# Patient Record
Sex: Female | Born: 1970
Health system: Southern US, Community
[De-identification: ages and names within clinical notes are randomized; demographics above are authoritative.]

## PROBLEM LIST (undated history)

## (undated) DIAGNOSIS — I8390 Asymptomatic varicose veins of unspecified lower extremity: Secondary | ICD-10-CM

## (undated) DIAGNOSIS — E739 Lactose intolerance, unspecified: Secondary | ICD-10-CM

## (undated) DIAGNOSIS — R51 Headache: Secondary | ICD-10-CM

## (undated) DIAGNOSIS — E119 Type 2 diabetes mellitus without complications: Secondary | ICD-10-CM

## (undated) DIAGNOSIS — R81 Glycosuria: Secondary | ICD-10-CM

## (undated) DIAGNOSIS — Z8632 Personal history of gestational diabetes: Secondary | ICD-10-CM

## (undated) DIAGNOSIS — N920 Excessive and frequent menstruation with regular cycle: Secondary | ICD-10-CM

## (undated) DIAGNOSIS — N921 Excessive and frequent menstruation with irregular cycle: Secondary | ICD-10-CM

## (undated) DIAGNOSIS — N926 Irregular menstruation, unspecified: Secondary | ICD-10-CM

## (undated) DIAGNOSIS — Z8742 Personal history of other diseases of the female genital tract: Secondary | ICD-10-CM

## (undated) DIAGNOSIS — Z87440 Personal history of urinary (tract) infections: Secondary | ICD-10-CM

## (undated) DIAGNOSIS — Z8619 Personal history of other infectious and parasitic diseases: Secondary | ICD-10-CM

## (undated) DIAGNOSIS — N840 Polyp of corpus uteri: Secondary | ICD-10-CM

## (undated) HISTORY — DX: Personal history of urinary (tract) infections: Z87.440

## (undated) HISTORY — DX: Personal history of other diseases of the female genital tract: Z87.42

## (undated) HISTORY — DX: Personal history of gestational diabetes: Z86.32

## (undated) HISTORY — PX: WISDOM TOOTH EXTRACTION: SHX21

## (undated) HISTORY — DX: Personal history of other infectious and parasitic diseases: Z86.19

## (undated) HISTORY — DX: Irregular menstruation, unspecified: N92.6

## (undated) HISTORY — PX: DILATION AND CURETTAGE OF UTERUS: SHX78

## (undated) HISTORY — DX: Excessive and frequent menstruation with regular cycle: N92.0

## (undated) HISTORY — DX: Glycosuria: R81

## (undated) HISTORY — DX: Lactose intolerance, unspecified: E73.9

## (undated) HISTORY — PX: BREAST SURGERY: SHX581

## (undated) HISTORY — DX: Polyp of corpus uteri: N84.0

## (undated) HISTORY — DX: Excessive and frequent menstruation with irregular cycle: N92.1

---
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
## 1995-01-13 DIAGNOSIS — Z87440 Personal history of urinary (tract) infections: Secondary | ICD-10-CM

## 1995-01-13 HISTORY — DX: Personal history of urinary (tract) infections: Z87.440

## 1997-10-11 ENCOUNTER — Ambulatory Visit (HOSPITAL_COMMUNITY): Admission: RE | Admit: 1997-10-11 | Discharge: 1997-10-11 | Payer: Self-pay | Admitting: Gynecology

## 1997-12-21 ENCOUNTER — Ambulatory Visit (HOSPITAL_COMMUNITY): Admission: RE | Admit: 1997-12-21 | Discharge: 1997-12-21 | Payer: Self-pay | Admitting: *Deleted

## 1998-01-01 ENCOUNTER — Encounter: Admission: RE | Admit: 1998-01-01 | Discharge: 1998-04-01 | Payer: Self-pay | Admitting: Obstetrics & Gynecology

## 1998-01-12 DIAGNOSIS — Z8632 Personal history of gestational diabetes: Secondary | ICD-10-CM

## 1998-01-12 HISTORY — DX: Personal history of gestational diabetes: Z86.32

## 1998-02-26 ENCOUNTER — Inpatient Hospital Stay (HOSPITAL_COMMUNITY): Admission: AD | Admit: 1998-02-26 | Discharge: 1998-02-28 | Payer: Self-pay | Admitting: Obstetrics and Gynecology

## 1998-03-03 ENCOUNTER — Inpatient Hospital Stay (HOSPITAL_COMMUNITY): Admission: AD | Admit: 1998-03-03 | Discharge: 1998-03-03 | Payer: Self-pay | Admitting: Obstetrics & Gynecology

## 1998-06-13 ENCOUNTER — Other Ambulatory Visit: Admission: RE | Admit: 1998-06-13 | Discharge: 1998-06-13 | Payer: Self-pay | Admitting: *Deleted

## 1998-08-15 ENCOUNTER — Other Ambulatory Visit: Admission: RE | Admit: 1998-08-15 | Discharge: 1998-08-15 | Payer: Self-pay | Admitting: *Deleted

## 1999-01-13 DIAGNOSIS — Z8742 Personal history of other diseases of the female genital tract: Secondary | ICD-10-CM

## 1999-01-13 HISTORY — DX: Personal history of other diseases of the female genital tract: Z87.42

## 1999-01-23 ENCOUNTER — Other Ambulatory Visit: Admission: RE | Admit: 1999-01-23 | Discharge: 1999-01-23 | Payer: Self-pay | Admitting: Obstetrics & Gynecology

## 1999-04-29 ENCOUNTER — Encounter (INDEPENDENT_AMBULATORY_CARE_PROVIDER_SITE_OTHER): Payer: Self-pay | Admitting: Specialist

## 1999-04-29 ENCOUNTER — Other Ambulatory Visit: Admission: RE | Admit: 1999-04-29 | Discharge: 1999-04-29 | Payer: Self-pay | Admitting: *Deleted

## 1999-10-01 ENCOUNTER — Encounter: Admission: RE | Admit: 1999-10-01 | Discharge: 1999-10-01 | Payer: Self-pay | Admitting: *Deleted

## 1999-10-01 ENCOUNTER — Encounter: Payer: Self-pay | Admitting: *Deleted

## 1999-10-21 ENCOUNTER — Other Ambulatory Visit: Admission: RE | Admit: 1999-10-21 | Discharge: 1999-10-21 | Payer: Self-pay | Admitting: *Deleted

## 2000-02-17 ENCOUNTER — Encounter: Payer: Self-pay | Admitting: Emergency Medicine

## 2000-02-17 ENCOUNTER — Emergency Department (HOSPITAL_COMMUNITY): Admission: EM | Admit: 2000-02-17 | Discharge: 2000-02-17 | Payer: Self-pay | Admitting: Emergency Medicine

## 2000-08-23 ENCOUNTER — Other Ambulatory Visit: Admission: RE | Admit: 2000-08-23 | Discharge: 2000-08-23 | Payer: Self-pay | Admitting: *Deleted

## 2001-03-10 ENCOUNTER — Other Ambulatory Visit: Admission: RE | Admit: 2001-03-10 | Discharge: 2001-03-10 | Payer: Self-pay | Admitting: *Deleted

## 2001-04-03 ENCOUNTER — Encounter: Payer: Self-pay | Admitting: *Deleted

## 2001-04-03 ENCOUNTER — Ambulatory Visit (HOSPITAL_COMMUNITY): Admission: RE | Admit: 2001-04-03 | Discharge: 2001-04-03 | Payer: Self-pay | Admitting: *Deleted

## 2001-04-23 ENCOUNTER — Ambulatory Visit (HOSPITAL_COMMUNITY): Admission: RE | Admit: 2001-04-23 | Discharge: 2001-04-23 | Payer: Self-pay | Admitting: Neurology

## 2001-04-23 ENCOUNTER — Encounter: Payer: Self-pay | Admitting: Neurology

## 2001-08-15 ENCOUNTER — Other Ambulatory Visit: Admission: RE | Admit: 2001-08-15 | Discharge: 2001-08-15 | Payer: Self-pay | Admitting: *Deleted

## 2001-11-09 ENCOUNTER — Encounter: Payer: Self-pay | Admitting: *Deleted

## 2001-11-09 ENCOUNTER — Encounter: Admission: RE | Admit: 2001-11-09 | Discharge: 2001-11-09 | Payer: Self-pay | Admitting: *Deleted

## 2002-02-16 ENCOUNTER — Other Ambulatory Visit: Admission: RE | Admit: 2002-02-16 | Discharge: 2002-02-16 | Payer: Self-pay | Admitting: *Deleted

## 2002-12-22 ENCOUNTER — Emergency Department (HOSPITAL_COMMUNITY): Admission: EM | Admit: 2002-12-22 | Discharge: 2002-12-22 | Payer: Self-pay | Admitting: Emergency Medicine

## 2002-12-22 IMAGING — US US ABDOMEN COMPLETE
1 series · 14 of 25 positions shown · non-contrast
Comparison: none

CLINICAL DATA: Abdominal pain.
 ABDOMEN ULTRASOUND ? 12/22/02

[Series 1: unknown · 0.28mm/px · 14 of 54 slices shown]
[im 1/54]
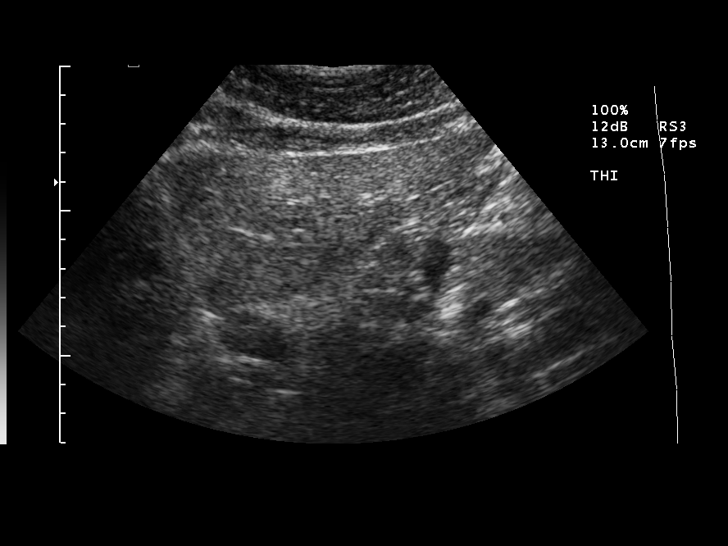
[im 5/54]
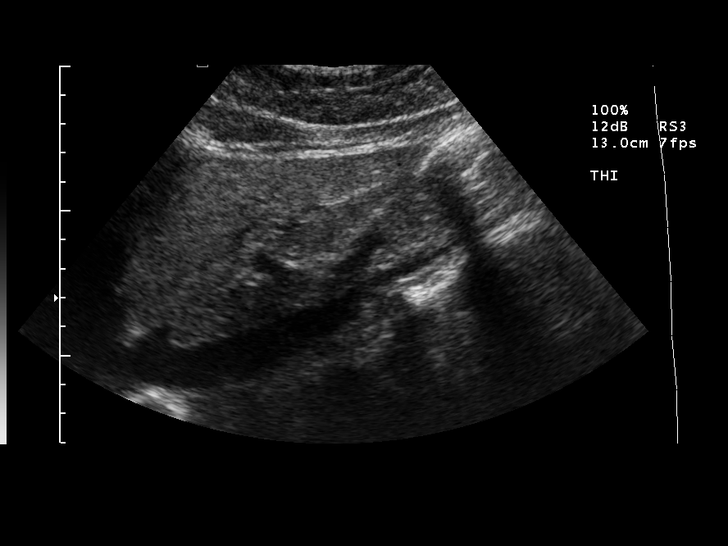
[im 9/54]
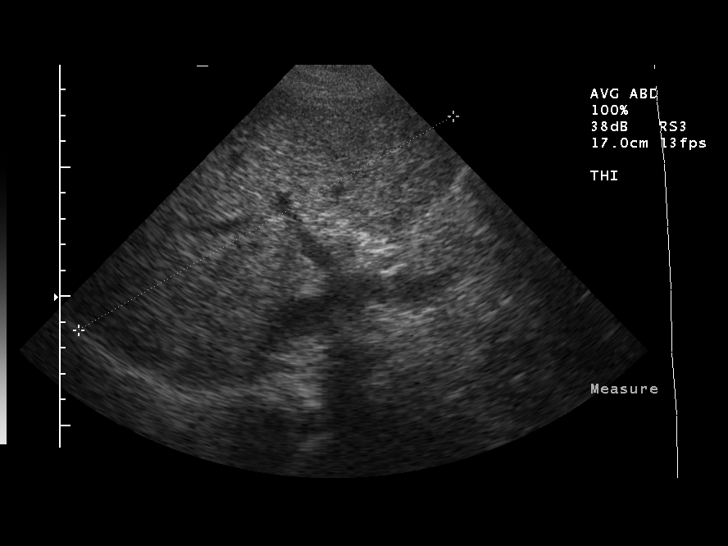
[im 14/54]
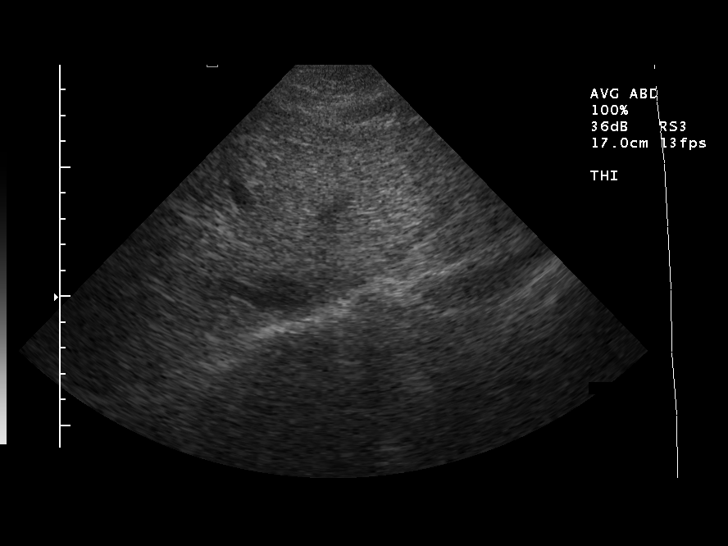
[im 18/54]
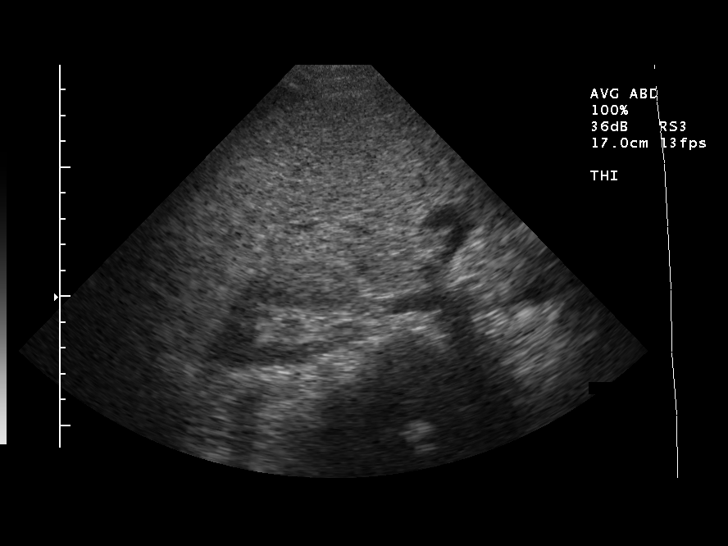
[im 20/54]
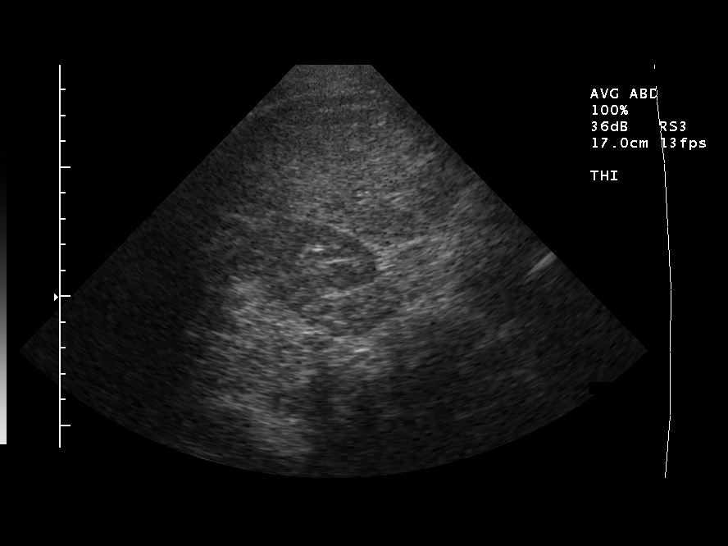
[im 25/54]
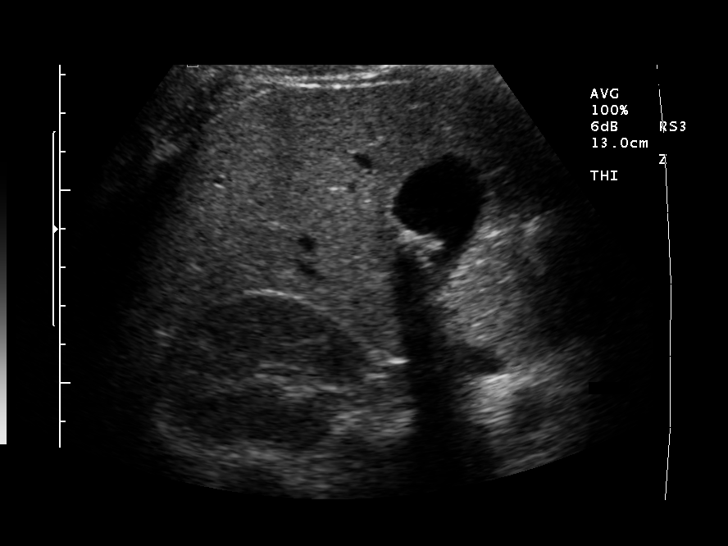
[im 29/54]
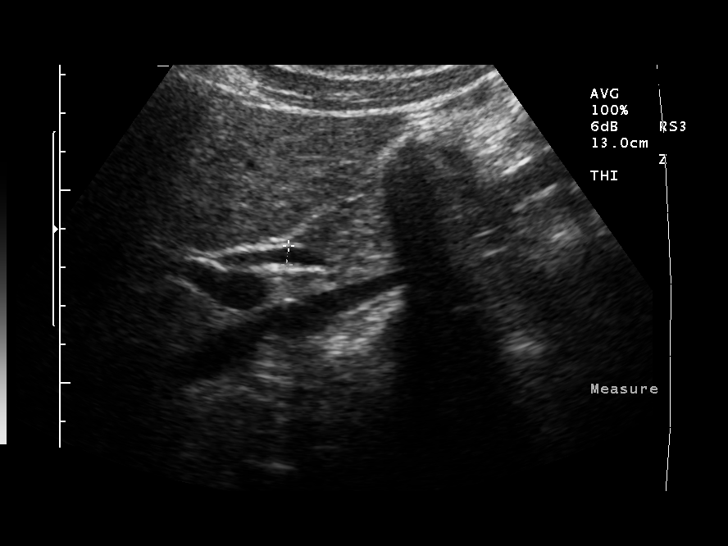
[im 34/54]
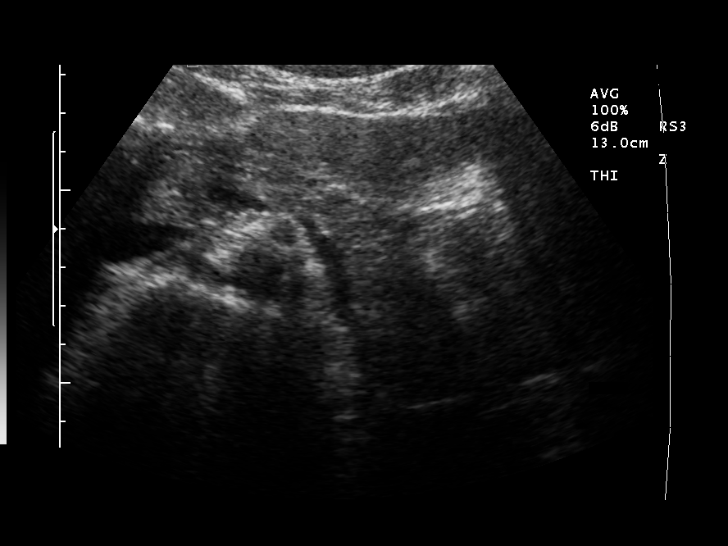
[im 36/54]
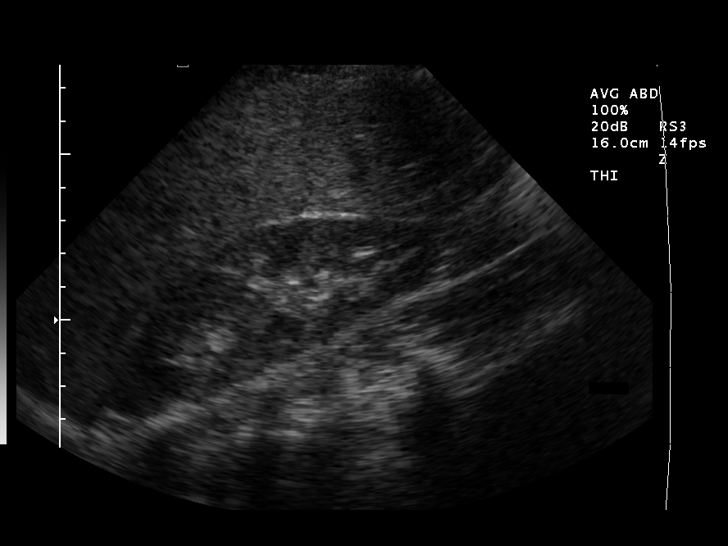
[im 40/54]
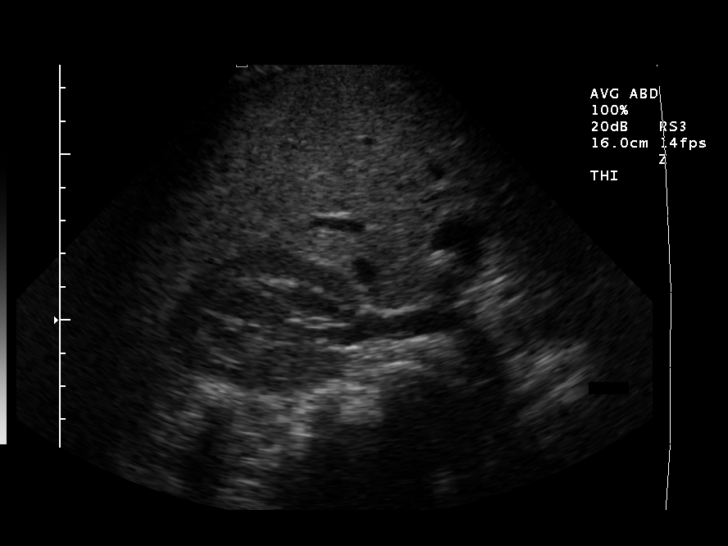
[im 45/54]
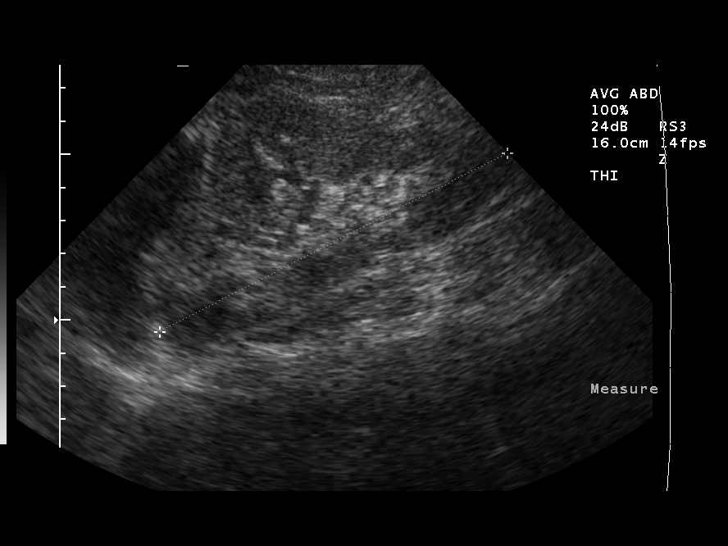
[im 49/54]
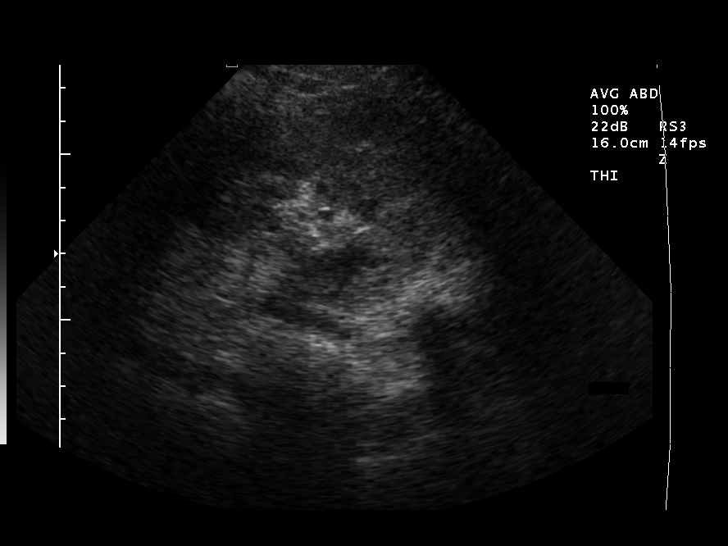
[im 54/54]
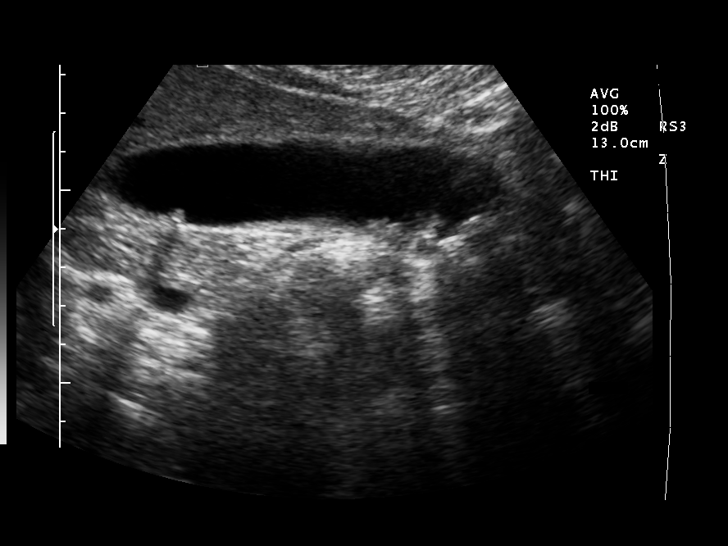

[14 of 25 positions shown; findings below may reference images not displayed]

FINDINGS: Multiple subcentimeter gallstones in the gallbladder, which appear to be mobile with changes in patient position.   There is no gallbladder wall thickening or pericholecystic fluid.  The common bile duct is normal in caliber at 4 mm.  The liver, spleen, kidneys, and visualized portions of the pancreas and aorta are unremarkable.  No ascites.
IMPRESSION: Cholelithiasis.

## 2003-01-13 DIAGNOSIS — Z8742 Personal history of other diseases of the female genital tract: Secondary | ICD-10-CM

## 2003-01-13 HISTORY — DX: Personal history of other diseases of the female genital tract: Z87.42

## 2003-01-13 HISTORY — PX: CHOLECYSTECTOMY: SHX55

## 2003-01-18 ENCOUNTER — Encounter (INDEPENDENT_AMBULATORY_CARE_PROVIDER_SITE_OTHER): Payer: Self-pay | Admitting: Specialist

## 2003-01-18 ENCOUNTER — Observation Stay (HOSPITAL_COMMUNITY): Admission: RE | Admit: 2003-01-18 | Discharge: 2003-01-19 | Payer: Self-pay | Admitting: General Surgery

## 2003-05-17 ENCOUNTER — Other Ambulatory Visit: Admission: RE | Admit: 2003-05-17 | Discharge: 2003-05-17 | Payer: Self-pay | Admitting: Obstetrics and Gynecology

## 2003-06-01 ENCOUNTER — Encounter: Admission: RE | Admit: 2003-06-01 | Discharge: 2003-06-01 | Payer: Self-pay | Admitting: Family Medicine

## 2004-05-19 ENCOUNTER — Other Ambulatory Visit: Admission: RE | Admit: 2004-05-19 | Discharge: 2004-05-19 | Payer: Self-pay | Admitting: Obstetrics and Gynecology

## 2005-05-26 ENCOUNTER — Other Ambulatory Visit: Admission: RE | Admit: 2005-05-26 | Discharge: 2005-05-26 | Payer: Self-pay | Admitting: Obstetrics and Gynecology

## 2006-01-12 HISTORY — PX: BREAST EXCISIONAL BIOPSY: SUR124

## 2006-06-02 DIAGNOSIS — N921 Excessive and frequent menstruation with irregular cycle: Secondary | ICD-10-CM

## 2006-06-02 HISTORY — DX: Excessive and frequent menstruation with irregular cycle: N92.1

## 2006-06-15 ENCOUNTER — Encounter: Admission: RE | Admit: 2006-06-15 | Discharge: 2006-06-15 | Payer: Self-pay | Admitting: Obstetrics and Gynecology

## 2006-06-22 ENCOUNTER — Encounter: Admission: RE | Admit: 2006-06-22 | Discharge: 2006-06-22 | Payer: Self-pay | Admitting: Obstetrics and Gynecology

## 2006-06-22 ENCOUNTER — Encounter (INDEPENDENT_AMBULATORY_CARE_PROVIDER_SITE_OTHER): Payer: Self-pay | Admitting: Radiology

## 2006-06-22 HISTORY — PX: BREAST BIOPSY: SHX20

## 2007-06-09 ENCOUNTER — Encounter: Admission: RE | Admit: 2007-06-09 | Discharge: 2007-06-09 | Payer: Self-pay | Admitting: Obstetrics and Gynecology

## 2007-08-30 DIAGNOSIS — R81 Glycosuria: Secondary | ICD-10-CM

## 2007-08-30 HISTORY — DX: Glycosuria: R81

## 2007-09-13 ENCOUNTER — Encounter: Admission: RE | Admit: 2007-09-13 | Discharge: 2007-09-13 | Payer: Self-pay | Admitting: Obstetrics and Gynecology

## 2008-01-13 DIAGNOSIS — Z8742 Personal history of other diseases of the female genital tract: Secondary | ICD-10-CM

## 2008-01-13 HISTORY — DX: Personal history of other diseases of the female genital tract: Z87.42

## 2009-01-12 DIAGNOSIS — N926 Irregular menstruation, unspecified: Secondary | ICD-10-CM

## 2009-01-12 DIAGNOSIS — N920 Excessive and frequent menstruation with regular cycle: Secondary | ICD-10-CM

## 2009-01-12 HISTORY — DX: Excessive and frequent menstruation with regular cycle: N92.0

## 2009-01-12 HISTORY — DX: Irregular menstruation, unspecified: N92.6

## 2009-09-30 DIAGNOSIS — N840 Polyp of corpus uteri: Secondary | ICD-10-CM

## 2009-09-30 HISTORY — DX: Polyp of corpus uteri: N84.0

## 2010-02-02 ENCOUNTER — Encounter: Payer: Self-pay | Admitting: Obstetrics and Gynecology

## 2010-02-05 ENCOUNTER — Ambulatory Visit (HOSPITAL_COMMUNITY)
Admission: RE | Admit: 2010-02-05 | Discharge: 2010-02-05 | Payer: Self-pay | Source: Home / Self Care | Attending: Obstetrics and Gynecology | Admitting: Obstetrics and Gynecology

## 2010-02-05 LAB — CBC
HCT: 38 % (ref 36.0–46.0)
Hemoglobin: 12.3 g/dL (ref 12.0–15.0)
MCH: 27.2 pg (ref 26.0–34.0)
MCHC: 32.4 g/dL (ref 30.0–36.0)
MCV: 83.9 fL (ref 78.0–100.0)
Platelets: 270 10*3/uL (ref 150–400)
RBC: 4.53 MIL/uL (ref 3.87–5.11)
RDW: 13.4 % (ref 11.5–15.5)
WBC: 6.7 10*3/uL (ref 4.0–10.5)

## 2010-02-05 LAB — URINE MICROSCOPIC-ADD ON

## 2010-02-05 LAB — URINALYSIS, ROUTINE W REFLEX MICROSCOPIC
Bilirubin Urine: NEGATIVE
Ketones, ur: NEGATIVE mg/dL
Leukocytes, UA: NEGATIVE
Nitrite: NEGATIVE
Protein, ur: NEGATIVE mg/dL
Specific Gravity, Urine: 1.02 (ref 1.005–1.030)
Urine Glucose, Fasting: NEGATIVE mg/dL
Urobilinogen, UA: 0.2 mg/dL (ref 0.0–1.0)
pH: 7 (ref 5.0–8.0)

## 2010-02-05 LAB — GLUCOSE, CAPILLARY: Glucose-Capillary: 91 mg/dL (ref 70–99)

## 2010-02-05 LAB — PREGNANCY, URINE: Preg Test, Ur: NEGATIVE

## 2010-02-11 NOTE — Op Note (Signed)
NAMECALVIN, Susan Wilkerson               ACCOUNT NO.:  [id number]  MEDICAL RECORD NO.:  [id number]          PATIENT TYPE:  AMB  LOCATION:  SDC                           FACILITY:  [facility]  PHYSICIAN:  Janine Limbo, M.D.DATE OF BIRTH:  03/07/1970  DATE OF PROCEDURE:  02/05/2010 DATE OF DISCHARGE:  02/05/2010                              OPERATIVE REPORT   PREOPERATIVE DIAGNOSES: 1. Menorrhagia. 2. Endometrial polyps. 3. Fibroid uterus.  POSTOPERATIVE DIAGNOSES: 1. Menorrhagia 2. Endometrial polyps. 3. Submucosal fibroid.  PROCEDURE: 1. Hysteroscopy. 2. Hysteroscopic resection of submucosal fibroid and endometrial     polyps. 3. Dilatation and curettage.  SURGEON:  Janine Limbo, M.D.  FIRST ASSISTANT:  None.  ANESTHETIC:  General.  DISPOSITION:  Susan Wilkerson is a 40 year old female, para 1-0-0-1, who presents with the above-mentioned diagnosis.  The patient understands the indications for her surgical procedure, and she accepts the risks of, but not limited to, anesthetic complications, bleeding, infection, and possible damage to the surrounding organs.  FINDINGS:  The patient's uterus was noted to be 8- to 10-week size and irregular.  There was descent of the cervix.  No adnexal masses were appreciated.  On hysteroscopy, the patient was noted to have 2 submucosal polyps with the largest polyp measuring approximately 1.5 cm. There was a submucosal fibroid noted that was 1 cm in size.  No other pathology was appreciated.  PROCEDURE:  The patient was taken to the operating room where a general anesthetic was given.  The patient's abdomen, perineum, and vagina were prepped with multiple layers of Betadine.  The bladder was drained of urine.  The patient was then sterilely draped.  Examination under anesthesia was performed.  A paracervical block was placed using 10 mL of 0.5% Marcaine with epinephrine.  An endocervical curettage was performed.  The uterus sounded to  9.5 cm.  The cervix was gently dilated.  The diagnostic hysteroscope was inserted, and the cavity was carefully inspected.  Pictures were taken.  The diagnostic hysteroscope was removed, and the cervix was dilated further.  The operative hysteroscope was inserted.  We then used a single loop to resect the endometrial polyp and the submucosal fibroid.  The patient tolerated her procedure well.  Pictures were taken.  At the end of our procedure, the cavity was felt to be clean.  The cavity was then curetted using a medium sharp curette.  The hysteroscope was inserted for final time and no other pathology was appreciated.  All instruments were removed.  The patient was noted to have some bleeding from her cervix and silver nitrate was used for hemostasis.  Hemostasis was then felt to be adequate.  All counts were thought to be correct.  The estimated blood loss for the procedure was less than 10 mL.  The estimated fluid deficit was 85 mL of sorbitol.  The patient was reexamined and the uterus was felt to be firm.  The patient was returned to the supine position.  Her anesthetic was reversed.  The patient was transported to the recovery room in stable condition.  The endometrial curettings, endometrial resections, and endocervical curettings were all  sent to Pathology.  FOLLOWUP INSTRUCTIONS:  The patient will return to see Dr. Stefano Gaul in 2 weeks.  She will call for questions or concerns.  She was given a copy of the postoperative instruction sheet as prepared by the [facility] for patients who have undergone hysteroscopy. The patient was given a prescription for: 1. Motrin 800 mg one tablet every 8 hours as needed for mild-to-     moderate pain. 2. Percocet 5/325 one tablet every 4 hours as needed for severe pain. 3. Phenergan 25 mg one tablet every 6 hours as needed for nausea.     Janine Limbo, M.D.     AVS/MEDQ  D:  02/05/2010  T:  02/06/2010  Job:   [id number]  Electronically Signed by Kirkland Hun M.D. on 02/11/2010 12:02:03 PM

## 2010-02-11 NOTE — H&P (Signed)
  NAME:  MABRY, SANTARELLI               ACCOUNT NO.:  [id number]  MEDICAL RECORD NO.:  [id number]          PATIENT TYPE:  AMB  LOCATION:  SDC                           FACILITY:  [facility]  PHYSICIAN:  Janine Limbo, M.D.DATE OF BIRTH:  [birth year]  DATE OF ADMISSION:  02/05/2010 DATE OF DISCHARGE:                             HISTORY & PHYSICAL   HISTORY OF PRESENT ILLNESS:  Susan Wilkerson is a 40 year old female, para 1-0- 0-1, who presents for hysteroscopy with resection and then a dilatation and curettage.  The patient has been followed at the [facility].  The patient complains of menorrhagia, fibroids, and an endometrial polyp.  A hydrosonogram was performed which showed an 8.62 x 8.55 cm uterus.  The endometrium measured 1.01 cm.  The ovaries were within normal limits. The patient was found to have two endometrial polyps with the largest polyp measuring 1.2 cm.  A total of six fibroids were seen and the largest fibroid measured 3.8 cm.  None of the fibroids were felt to be submucosal.  An endometrial biopsy showed benign tissue.  DRUG ALLERGIES:  NO KNOWN DRUG ALLERGIES.  PAST MEDICAL HISTORY:  The patient currently takes oral contraceptives for birth control.  She has a history of diabetes mellitus and she takes Enalapril 5 mg daily.  OBSTETRICAL HISTORY:  The patient has had one vaginal delivery at term.  SOCIAL HISTORY:  The patient denies cigarette use, alcohol use, and recreational drug use.  REVIEW OF SYSTEMS:  See history of present illness.  FAMILY HISTORY:  Noncontributory.  PHYSICAL EXAMINATION:  VITAL SIGNS:  Weight is 149 pounds.  Height is 5 feet 2 inches. HEENT: Within normal limits. CHEST:  Clear. HEART: Regular rate and rhythm. BREASTS:  Without masses. ABDOMEN:  Nontender. EXTREMITIES:  Grossly normal and her neurologic exam is grossly normal. PELVIC EXAM:  External genitalia is normal.  Vagina is  normal.  Cervix descends one half of the length of the vagina.  The uterus is posterior and irregular.  Adnexa with no masses and rectovaginal exam confirms.  ASSESSMENT: 1. Menorrhagia. 2. Endometrial polyps. 3. Fibroid uterus.  PLAN:  The patient will undergo hysteroscopy with resection of endometrial polyps.  She will also have a dilatation and curettage.  She understands the indications for her surgical procedure and she accepts the risks of, but not limited to, anesthetic complications, bleeding, infection, and possible damage to the surrounding organs.     Janine Limbo, M.D.     AVS/MEDQ  D:  02/04/2010  T:  02/05/2010  Job:  [id number]  Electronically Signed by Kirkland Hun M.D. on 02/11/2010 12:01:36 PM

## 2010-03-18 ENCOUNTER — Other Ambulatory Visit: Payer: Self-pay | Admitting: Family Medicine

## 2010-03-18 DIAGNOSIS — Z1231 Encounter for screening mammogram for malignant neoplasm of breast: Secondary | ICD-10-CM

## 2010-03-26 ENCOUNTER — Ambulatory Visit: Payer: Self-pay

## 2010-03-27 ENCOUNTER — Ambulatory Visit
Admission: RE | Admit: 2010-03-27 | Discharge: 2010-03-27 | Disposition: A | Discharge status: Home / Self Care | Payer: BC Managed Care – PPO | Source: Ambulatory Visit | Attending: Family Medicine | Admitting: Family Medicine

## 2010-03-27 DIAGNOSIS — Z1231 Encounter for screening mammogram for malignant neoplasm of breast: Secondary | ICD-10-CM

## 2010-05-30 NOTE — Op Note (Signed)
NAME:  Susan Wilkerson, Susan Wilkerson                         ACCOUNT NO.:  [id number]   MEDICAL RECORD NO.:  [id number]                   PATIENT TYPE:  AMB   LOCATION:  DAY                                  FACILITY:  [facility]   PHYSICIAN:  Anselm Pancoast. Zachery Dakins, M.D.          DATE OF BIRTH:  [birth year]   DATE OF PROCEDURE:  01/18/2003  DATE OF DISCHARGE:                                 OPERATIVE REPORT   PREOPERATIVE DIAGNOSIS:  Chronic cholecystitis.   POSTOPERATIVE DIAGNOSIS:  Chronic cholecystitis.   OPERATION:  Laparoscopic cholecystectomy with intraoperative cholangiogram.   ANESTHESIA:  General.   HISTORY:  Susan Wilkerson is a 40 year old female who was referred to [place]  courtesy of Dr. Freida Busman who was working for [employer] ER where she presented  prior to Christmas with an episode of epigastric pain consistent with a  gallbladder attack. She is followed by the [facility] physicians but they have  not actually seen her for this procedure. She had an ultrasound over at [facility]  that showed multiple stones within the gallbladder.  The common bile duct  was 4 mm in size and her pain had subsided and she was released with  instructions to see [place] in the office. I saw her the 20th of December and she  wanted to proceed with laparoscopic cholecystectomy after Christmas. She is  otherwise in good health and stated that she had had episodes of epigastric  pain which they had thought possibly were ulcers in that she had seen the  [facility] physicians who placed her on Pepcid medications but the symptoms would  occur intermittently. She had a more intense episode and it prompted the  visit to the [facility] ER where the ultrasound was obtained. Her liver function  studies were normal and preop she was given 3 g of Unasyn and PAS stockings.   DESCRIPTION OF PROCEDURE:  The patient was taken to the operative suite,  induction of general anesthesia, endotracheal tube, oral tube into the  stomach and the abdomen was prepped  with Betadine surgical scrub and  solution and draped in a sterile manner. The spinal incision was made below  the umbilicus in the fascia where it was identified and was carefully picked  up between Kocher's and a small opening made into the peritoneal cavity.  A  pursestring suture of #0 Vicryl was placed, Hasson cannula introduced and  the carbon dioxide use. The gallbladder was very swollen but not acutely  inflamed and you could see the multiple stones within it and the upper 10 mm  trocar was placed in the subxiphoid area after anesthetizing the fascia with  Marcaine.  Two lateral 5 mm ports were placed by Dr. Maple Hudson under direct  vision after anesthetizing the fascia.  The abdominal wall was retracted  upward and outward, the peritoneum proximally was carefully opened. The  cystic duct was encompassed, the cystic artery was first separated from the  gallbladder, doubly  clipped proximally, singly distally, divided and then  this allowed [place] to encompass the cystic duct easily.  There was a stone  impacted in the neck of gallbladder, it was pushed back and a clip was  placed at the distal cystic duct, a small opening made and the [facility] catheter  introduced into the proximal cystic duct. This was held in place with a  clip. An x-ray was obtained which showed good flow into the extrahepatic  biliary system and into the duodenum.  No evidence of any common duct stones  were noted. The catheter was removed, the cystic duct was triply clipped and  then divided.  The posterior area where there may have been a posterior  branch of the cystic artery was doubly clipped and divided and then the  gallbladder was kind of freed from its bed with hook electrocautery.  No  bile spilled as we grasped the gallbladder free with camera in the upper 10  mm port and withdrew it through the fascia of the umbilicus.  I placed a  second figure-of-eight suture of #0 Vicryl, the irrigating fluid that had  been  used had been aspirated, no evidence of any bile or bleeding and the  two lateral 5 mm trocars were withdrawn.  Carbon dioxide released, the upper  10 mm trocar withdrawn under direct vision and the  subcutaneous wound was closed with 4-0 Vicryl, Benzoin and Steri-Strips on  the skin.  The patient tolerated the procedure nicely and was released from  the OR and is considered being released this afternoon since the bed  shortage.                                               Anselm Pancoast. Zachery Dakins, M.D.    WJW/MEDQ  D:  01/18/2003  T:  01/18/2003  Job:  [id number]   cc:   [facility]

## 2011-02-18 ENCOUNTER — Other Ambulatory Visit: Payer: Self-pay | Admitting: Family Medicine

## 2011-02-18 DIAGNOSIS — Z1231 Encounter for screening mammogram for malignant neoplasm of breast: Secondary | ICD-10-CM

## 2011-03-30 ENCOUNTER — Ambulatory Visit
Admission: RE | Admit: 2011-03-30 | Discharge: 2011-03-30 | Disposition: A | Discharge status: Home / Self Care | Payer: BC Managed Care – PPO | Source: Ambulatory Visit | Attending: Family Medicine | Admitting: Family Medicine

## 2011-03-30 DIAGNOSIS — Z1231 Encounter for screening mammogram for malignant neoplasm of breast: Secondary | ICD-10-CM

## 2011-05-24 ENCOUNTER — Other Ambulatory Visit: Payer: Self-pay | Admitting: Physician Assistant

## 2011-05-24 ENCOUNTER — Ambulatory Visit (INDEPENDENT_AMBULATORY_CARE_PROVIDER_SITE_OTHER): Payer: BC Managed Care – PPO | Admitting: Physician Assistant

## 2011-05-24 VITALS — BP 108/73 | HR 88 | Temp 98.4°F | Resp 16 | Ht 62.0 in | Wt 161.0 lb

## 2011-05-24 DIAGNOSIS — J029 Acute pharyngitis, unspecified: Secondary | ICD-10-CM

## 2011-05-24 LAB — POCT RAPID STREP A (OFFICE): Rapid Strep A Screen: NEGATIVE

## 2011-05-24 MED ORDER — AMOXICILLIN 875 MG PO TABS: 875.0000 mg | ORAL_TABLET | Freq: Two times a day (BID) | ORAL | Status: AC

## 2011-05-24 NOTE — Progress Notes (Signed)
  Subjective:    Patient ID: Susan Wilkerson, female    DOB: [birth year], 41 y.o.   MRN: [id number]  HPI Susan Wilkerson started feeling ill 3 days ago.  ST, mild congestion and cough.  Throat persists.  White spots on throat today.  No fever or chills.  No n/v/d  Has DM 2 and controls this with diet and exercise.   Review of Systems As above    Objective:   Physical Exam  Constitutional: She is oriented to person, place, and time. Vital signs are normal.  HENT:  Right Ear: Tympanic membrane normal.  Left Ear: Tympanic membrane normal.  Nose: Mucosal edema present.  Mouth/Throat: Oropharyngeal exudate, posterior oropharyngeal edema and posterior oropharyngeal erythema present.  Cardiovascular: Normal rate and regular rhythm.   Pulmonary/Chest: Effort normal and breath sounds normal.  Lymphadenopathy:    She has cervical adenopathy.  Neurological: She is alert and oriented to person, place, and time.  Skin: Skin is warm.     Results for orders placed in visit on 05/24/11  POCT RAPID STREP A (OFFICE)      Component Value Range   Rapid Strep A Screen Negative  Negative         Assessment & Plan:  Pharyngitis  Send culture Amoxicillin and warm salt water gargles.  Mucinex if needed. Out of work 05/25/11

## 2011-05-26 LAB — CULTURE, GROUP A STREP: Organism ID, Bacteria: NORMAL

## 2011-06-09 ENCOUNTER — Telehealth: Payer: Self-pay

## 2011-06-09 NOTE — Telephone Encounter (Signed)
PT STATES SHE HAS BEEN RECEIVING SOME CALLS TO CHECK ON HER SYMPTOMS,SHE WANTS [place] TO KNOW SHE IS DOING WELL.   BEST PHONE  [phone]

## 2011-08-24 ENCOUNTER — Encounter: Payer: Self-pay | Admitting: Obstetrics and Gynecology

## 2011-08-24 ENCOUNTER — Ambulatory Visit (INDEPENDENT_AMBULATORY_CARE_PROVIDER_SITE_OTHER): Payer: BC Managed Care – PPO

## 2011-08-24 ENCOUNTER — Ambulatory Visit (INDEPENDENT_AMBULATORY_CARE_PROVIDER_SITE_OTHER): Payer: BC Managed Care – PPO | Admitting: Obstetrics and Gynecology

## 2011-08-24 VITALS — BP 122/72 | Temp 98.5°F | Wt 160.0 lb

## 2011-08-24 DIAGNOSIS — D259 Leiomyoma of uterus, unspecified: Secondary | ICD-10-CM

## 2011-08-24 DIAGNOSIS — D219 Benign neoplasm of connective and other soft tissue, unspecified: Secondary | ICD-10-CM | POA: Insufficient documentation

## 2011-08-24 DIAGNOSIS — R109 Unspecified abdominal pain: Secondary | ICD-10-CM

## 2011-08-24 LAB — POCT URINALYSIS DIPSTICK
Bilirubin, UA: NEGATIVE
Glucose, UA: NEGATIVE
Nitrite, UA: NEGATIVE
Spec Grav, UA: 1.02

## 2011-08-24 NOTE — Progress Notes (Signed)
HISTORY OF PRESENT ILLNESS  [name] is a 41 y.o. year old female,G1P1001, who presents for a problem visit. She complains of a left lower quadrant pain that happens 2-3 times each month. She has a known history of fibroids.  Subjective:  The patient rates her pain as 2/10.  It is not related to her menstrual cycle.  She has had some constipation followed by looseness in her stools.  She denies urinary tract symptoms.  She declined STD testing.  Objective:  BP 122/72  Temp 98.5 F (36.9 C) (Oral)  Wt 160 lb (72.576 kg)  LMP 08/18/2011   General: alert, cooperative and no distress Resp: clear to auscultation bilaterally Cardio: regular rate and rhythm, S1, S2 normal, no murmur, click, rub or gallop GI: soft and nontender, no masses back: No CVA tenderness  External genitalia: normal general appearance Vaginal: normal without tenderness, induration or masses Cervix: normal appearance and nontender Adnexa: normal bimanual exam Uterus: 12 weeks size, irregular, firm  UA: Negative  Ultrasound: 10.9 x 8.1 cm.  5 fibroids.  Largest 4.5 x 4.1 cm.  Endometrium 0.352 cm.  Ovaries within normal limits.  Assessment:  Left lower quadrant pain of uncertain etiology. Fibroid uterus.  Plan:  Causes of abdominal pain were discussed.  Options for evaluation and management were reviewed.  The patient wishes to have an ultrasound today.  Return to office prn if symptoms worsen or fail to improve.   Leonard Schwartz M.D.  08/24/2011 9:53 AM   Vag. Discharge:no Odor:no Fever:no Irreg.Periods:yes Pt states her last period was shorter and lighter than normal  Dyspareunia:no Dysuria:no Frequency:yes Urgency:no Hematuria:no Kidney stones:no Constipation:yes Diarrhea:yes Rectal Bleeding: no Vomiting:no Nausea:yes Pregnant:no Fibroids:yes Endometriosis:no Hx of Ovarian Cyst:no Hx IUD:yes, mirena 2-3 years ago Hx STD-PID:no Appendectomy:no Gall Bladder  Dz:yes Pt also c/o cramping.

## 2011-10-01 ENCOUNTER — Encounter: Payer: Self-pay | Admitting: Obstetrics and Gynecology

## 2011-10-01 ENCOUNTER — Ambulatory Visit (INDEPENDENT_AMBULATORY_CARE_PROVIDER_SITE_OTHER): Payer: BC Managed Care – PPO | Admitting: Obstetrics and Gynecology

## 2011-10-01 VITALS — BP 112/80 | HR 84 | Ht 62.0 in | Wt 162.0 lb

## 2011-10-01 DIAGNOSIS — Z01419 Encounter for gynecological examination (general) (routine) without abnormal findings: Secondary | ICD-10-CM

## 2011-10-01 DIAGNOSIS — Z124 Encounter for screening for malignant neoplasm of cervix: Secondary | ICD-10-CM

## 2011-10-01 MED ORDER — NORETHINDRONE-ETH ESTRADIOL 0.4-35 MG-MCG PO TABS: 1.0000 | ORAL_TABLET | Freq: Every day | ORAL | Status: DC

## 2011-10-01 NOTE — Progress Notes (Signed)
Subjective:    Susan Wilkerson is a 41 y.o. female, G1P1001, who presents for an annual exam. The patient has a known history of fibroids.  She has occasional abdominal pain.  Her mammogram showed dense breast, but otherwise is normal.  She has a known history of diabetes.  Her control is better.  Prior Hysterectomy: No    History   Social History  . Marital Status: Married    Spouse Name: N/A    Number of Children: N/A  . Years of Education: N/A   Social History Main Topics  . Smoking status: Never Smoker   . Smokeless tobacco: None  . Alcohol Use: No  . Drug Use: No  . Sexually Active: Yes    Birth Control/ Protection: Pill     ovcon   Other Topics Concern  . None   Social History Narrative  . None    Menstrual cycle:   LMP: Patient's last menstrual period was 09/15/2011.           Cycle: Regular, monthly with normal flow and no severe dysmenorrha  The following portions of the patient's history were reviewed and updated as appropriate: allergies, current medications, past family history, past medical history, past social history, past surgical history and problem list.  Review of Systems Pertinent items are noted in HPI. Breast:Negative for breast lump,nipple discharge or nipple retraction Gastrointestinal: Negative for abdominal pain, change in bowel habits or rectal bleeding Urinary:negative   Objective:    BP 112/80  Pulse 84  Ht 5\' 2"  (1.575 m)  Wt 162 lb (73.483 kg)  BMI 29.63 kg/m2  LMP 09/15/2011    Weight:  Wt Readings from Last 1 Encounters:  10/01/11 162 lb (73.483 kg)          BMI: Body mass index is 29.63 kg/(m^2).  General Appearance: Alert, appropriate appearance for age. No acute distress HEENT: Grossly normal Neck / Thyroid: Supple, no masses, nodes or enlargement Lungs: clear to auscultation bilaterally Back: No CVA tenderness Breast Exam: No masses or nodes.No dimpling, nipple retraction or discharge. Cardiovascular: Regular rate and  rhythm. S1, S2, no murmur Gastrointestinal: Soft, non-tender, no masses or organomegaly  ++++++++++++++++++++++++++++++++++++++++++++++++++++++++  Pelvic Exam: External genitalia: normal general appearance Vaginal: relaxation Cervix: normal appearance Adnexa: normal bimanual exam Uterus: 10 week size, irregular Rectovaginal: normal rectal, no masses  ++++++++++++++++++++++++++++++++++++++++++++++++++++++++  Lymphatic Exam: Non-palpable nodes in neck, clavicular, axillary, or inguinal regions Neurologic: Normal speech, no tremor  Psychiatric: Alert and oriented, appropriate affect.   Assessment:    fibroid uterus   Dense breast  Diabetes  Overweight or obese: Yes   Pelvic relaxation: Yes   Plan:    mammogram pap smear return annually or prn Contraception:Ovcon 1/35    STD screen request: No   The updated Pap smear screening guidelines were discussed with the patient. The patient requested that I obtain a Pap smear: Yes.  Kegel exercises discussed: Yes.  Proper diet and regular exercise were reviewed.  Annual mammograms recommended starting at age 20. Proper breast care was discussed.  Screening colonoscopy is recommended beginning at age 78.  Regular health maintenance was reviewed.  Sleep hygiene was discussed.  Adequate calcium and vitamin D intake was emphasized.  The patient and I discussed the implications of having dense breast on mammogram.  Only six states in the [place] require that patients be notified of dense breast on mammogram.  [place] recently passed a Social worker requiring notification. Only one state requires that insurance companies  pay for follow-up evaluation.  The patient was told that dense breast are an independent risk factor for breast cancer.  She was told that having dense breast does not mean that the patient has breast cancer, nor will develop breast cancer.  It is known that dense breast make it more difficult to read and  interpret mammograms.  There are other modalities for evaluating breast.  They include breast ultrasound, tomosynthesis, and MRI.  The patient understands that these tests are expensive, and that her insurance company may or may not pay to have these done. Ultrasound, tomosynthesis, and MRI show more findings that are not cancer, which can result in additional testing and unnecessary biopsies. The patient was offered the previously mentioned tests, but declines further testing at this time. The patient was told that the [employer] of [employer]stetricians and Gynecologists recommends annual mammograms starting at age 33.  Breast awareness was reviewed with the patient.  Her questions were answered.  The patient will return for her annual exam.  Leonard Schwartz M.D.   Regular Periods: yes Mammogram: yes  Monthly Breast Ex.: yes Exercise: yes  Tetanus < 10 years: no, pt unsure Seatbelts: yes  NI. Bladder Functn.: yes Abuse at home: no  Daily BM's: no every other day Stressful Work: yes  Healthy Diet: no Sigmoid-Colonoscopy: 1995   Calcium: yes Medical problems this year: none   LAST PAP:09/11/08 wnl, Pt due for Pap today  Contraception: pill-ovcon   Mammogram:  04/02/11 wnl, dense breast  PCP: [facility]   PMH: none   FMH: none  Last Bone Scan: n/a

## 2011-10-02 LAB — PAP IG W/ RFLX HPV ASCU

## 2011-12-15 ENCOUNTER — Telehealth: Payer: Self-pay | Admitting: Obstetrics and Gynecology

## 2011-12-15 ENCOUNTER — Other Ambulatory Visit: Payer: Self-pay

## 2011-12-15 NOTE — Telephone Encounter (Signed)
1 month supply of generic Rubye Oaks was called into [facility] [address] Pt was called to be made aware Rx faxed to [facility].  [facility] CMA

## 2011-12-29 ENCOUNTER — Other Ambulatory Visit: Payer: Self-pay | Admitting: Obstetrics and Gynecology

## 2012-01-04 ENCOUNTER — Telehealth: Payer: Self-pay | Admitting: Obstetrics and Gynecology

## 2012-01-04 NOTE — Telephone Encounter (Signed)
Tc to pt per telephone call. PC to [facility]. Spoke with Magda Paganini. Rx was sent out on 01/04/12 to pt's home. Pt agrees.

## 2012-02-12 ENCOUNTER — Telehealth: Payer: Self-pay | Admitting: Obstetrics and Gynecology

## 2012-02-12 NOTE — Telephone Encounter (Signed)
Tc to pt regarding msg.  Pt informed we would need to come in for eval with AVS.  Pt voices agreement.  Appt scheduled for Monday 02/15/12 @ 1615 for eval.

## 2012-02-15 ENCOUNTER — Encounter: Payer: Self-pay | Admitting: Obstetrics and Gynecology

## 2012-02-15 ENCOUNTER — Ambulatory Visit: Payer: BC Managed Care – PPO | Admitting: Obstetrics and Gynecology

## 2012-02-15 VITALS — BP 126/72 | Ht 62.0 in | Wt 156.0 lb

## 2012-02-15 DIAGNOSIS — N6320 Unspecified lump in the left breast, unspecified quadrant: Secondary | ICD-10-CM

## 2012-02-15 NOTE — Progress Notes (Signed)
HISTORY OF PRESENT ILLNESS  [name] is a 42 y.o. year old female,G1P1001, who presents for a problem visit. The patient has a sister that died from breast cancer. Operating was at age 39.  Child was born at age 89.  She has had a breast biopsy in the past.  Subjective:  The patient complains of a tender breast mass for 1 week.  She denies bleeding and discharge from the breast.  Objective:  BP 126/72  Ht 5\' 2"  (1.575 m)  Wt 156 lb (70.761 kg)  BMI 28.53 kg/m2  LMP 02/02/2012   breast exam: No masses or discharge on the right.  There is a 1 cm mobile slightly tender lesion in the left outer mid breast.  No discharge from the left breast.  There is hyperpigmented skin on the left outer breast.  Exam deferred.  Assessment:  Left breast mass.  Sister diied from breast cancer.  Plan:  Diagnostic mammogram.  Return to office in 1 month(s).   Leonard Schwartz M.D.  02/15/2012 5:43 PM

## 2012-02-16 ENCOUNTER — Other Ambulatory Visit: Payer: Self-pay | Admitting: Obstetrics and Gynecology

## 2012-02-16 DIAGNOSIS — N6320 Unspecified lump in the left breast, unspecified quadrant: Secondary | ICD-10-CM

## 2012-02-25 ENCOUNTER — Other Ambulatory Visit: Payer: BC Managed Care – PPO

## 2012-02-29 ENCOUNTER — Other Ambulatory Visit: Payer: BC Managed Care – PPO

## 2012-03-08 ENCOUNTER — Other Ambulatory Visit: Payer: Self-pay | Admitting: Obstetrics and Gynecology

## 2012-03-08 ENCOUNTER — Ambulatory Visit
Admission: RE | Admit: 2012-03-08 | Discharge: 2012-03-08 | Disposition: A | Discharge status: Home / Self Care | Payer: BC Managed Care – PPO | Source: Ambulatory Visit | Attending: Obstetrics and Gynecology | Admitting: Obstetrics and Gynecology

## 2012-03-08 DIAGNOSIS — Z139 Encounter for screening, unspecified: Secondary | ICD-10-CM

## 2012-03-08 DIAGNOSIS — N6320 Unspecified lump in the left breast, unspecified quadrant: Secondary | ICD-10-CM

## 2012-03-08 IMAGING — MG MM DIGITAL DIAGNOSTIC UNILAT*L*
3 series · 3 of 3 positions shown · non-contrast
Comparison: Previous examinations.

CLINICAL DATA: The patient notes palpable left breast mass.
History of a previous benign left breast excisional biopsy.

DIGITAL DIAGNOSTIC LEFT BREAST MAMMOGRAM WITH CAD AND LEFT BREAST
ULTRASOUND:

[L CC]
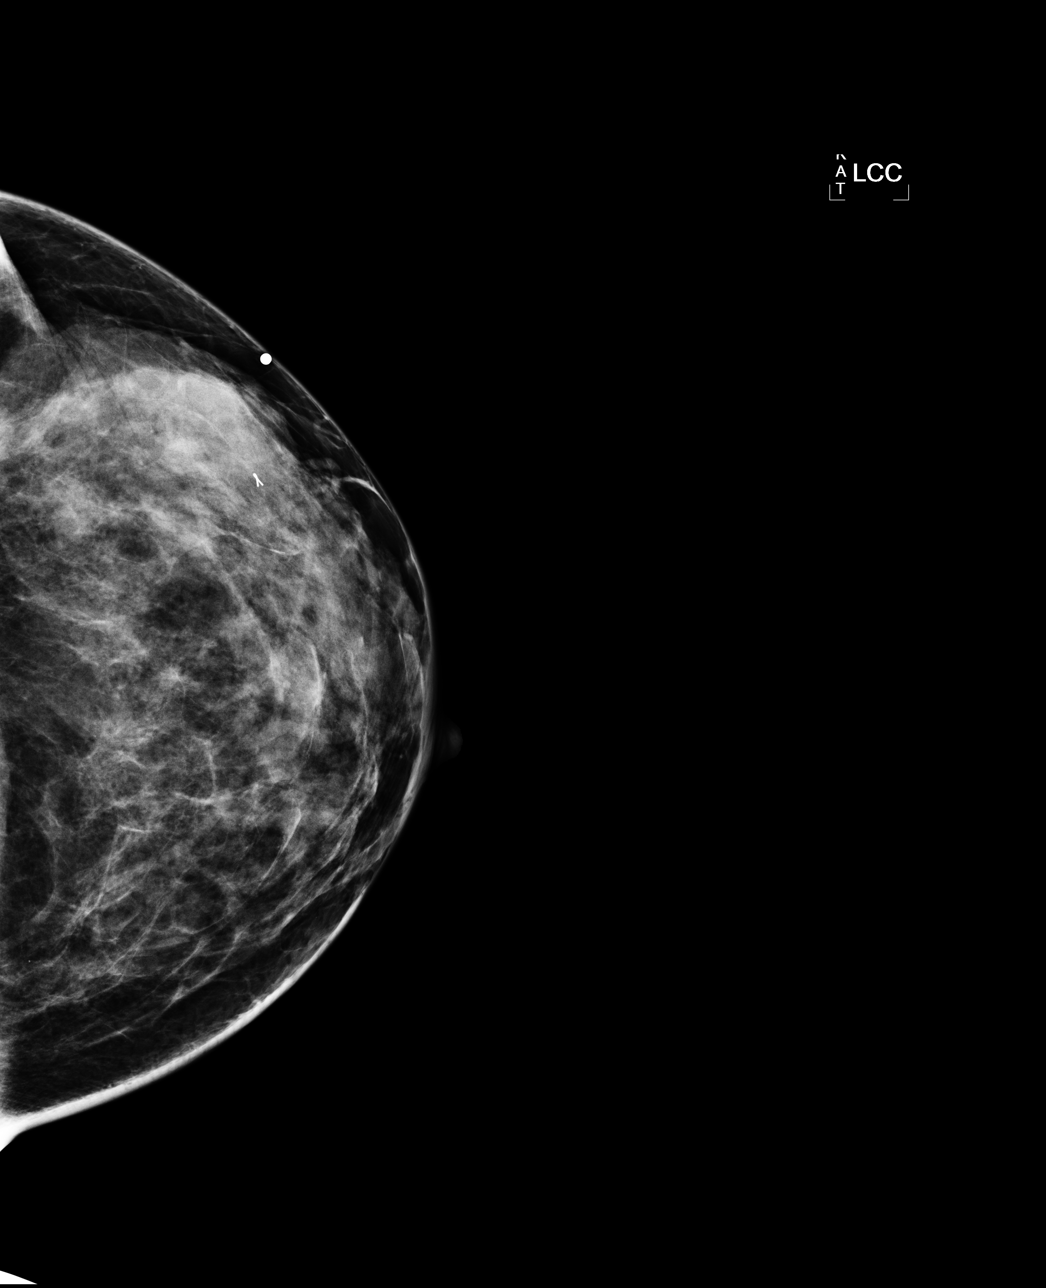

[L MLO]
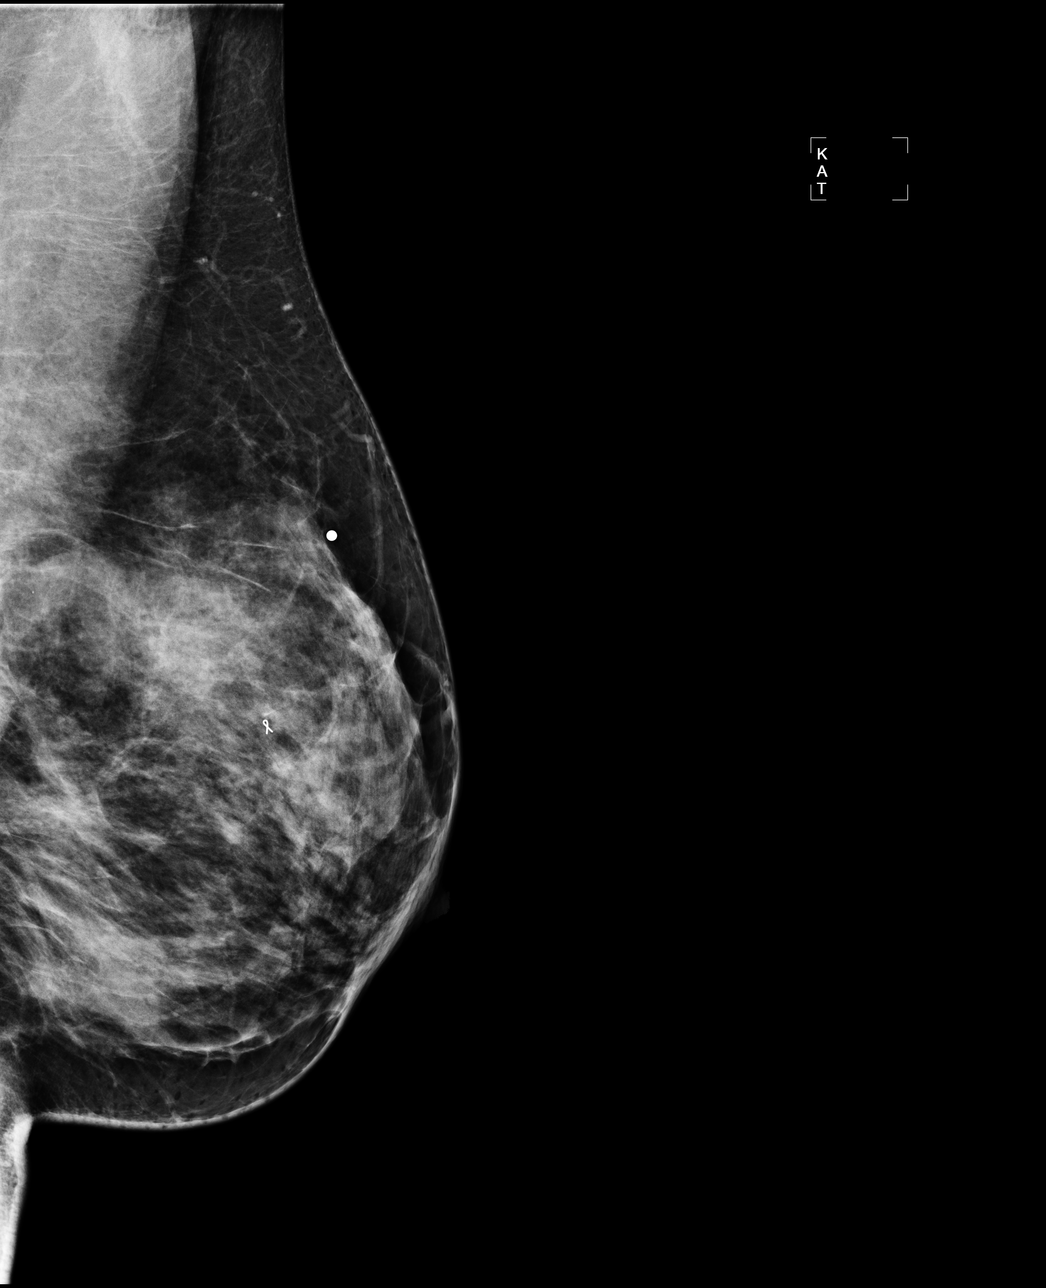

[L TAN]
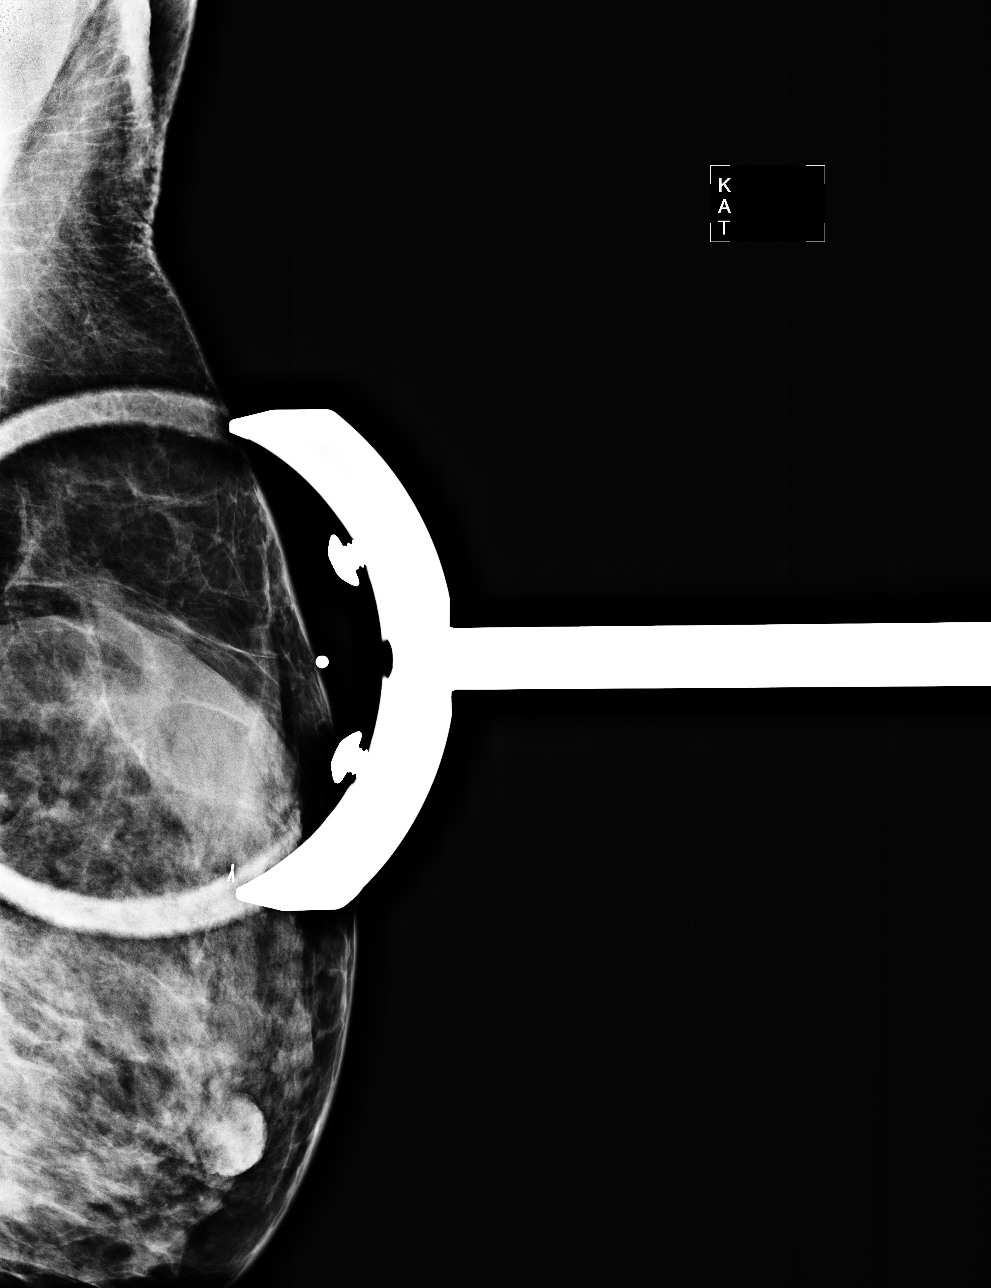

[3 of 3 positions shown; findings below may reference images not displayed]

FINDINGS: ACR Breast Density Category 3: The breast tissue is heterogeneously
dense.

There is a ribbon shaped clip seen within the upper-outer quadrant
left breast related to previous benign core biopsy.  There is no
mass, worrisome distortion, or worrisome calcification within the
upper-outer quadrant of the left breast.

Mammographic images were processed with CAD.

On physical exam, there is thickening within the upper-outer
quadrant left breast present without a discrete palpable mass at
this time. There is a well healed incision seen within the upper-
outer quadrant left breast related to a previous benign biopsy.

Ultrasound is performed, showing prominent but normal-appearing
fibroglandular tissue within the upper-outer quadrant left breast
and a small amount of postoperative scarring related to the
patient's previous benign excisional biopsy. There is no mass or
worrisome distortion.
IMPRESSION: No findings worrisome for malignancy.  Thickening within the upper-
outer quadrant left breast corresponds prominent fibroglandular
tissue.  Breast self-examination was reviewed with the patient.

RECOMMENDATION:
Screening mammography in March 2012 at her screening anniversary
date.

I have discussed the findings and recommendations with the patient.
Results were also provided in writing at the conclusion of the
visit.

BI-RADS CATEGORY 1:  Negative.

## 2012-03-10 ENCOUNTER — Encounter: Payer: Self-pay | Admitting: Obstetrics and Gynecology

## 2012-03-10 ENCOUNTER — Ambulatory Visit: Payer: BC Managed Care – PPO | Admitting: Obstetrics and Gynecology

## 2012-03-10 VITALS — BP 120/64 | Ht 62.0 in | Wt 156.0 lb

## 2012-03-10 DIAGNOSIS — N63 Unspecified lump in unspecified breast: Secondary | ICD-10-CM

## 2012-03-10 NOTE — Progress Notes (Signed)
HISTORY OF PRESENT ILLNESS  [name] is a 42 y.o. year old female,G1P1001, who presents for a problem visit. The patient was found to have a left breast mass on examination on February 15, 2012.  Evaluation at the breast center showed no masses or lesions.  Subjective:  The patient says that her breast mass has now resolved.  She denies bleeding and discharge.  Objective:  BP 120/64  Ht 5\' 2"  (1.575 m)  Wt 156 lb (70.761 kg)  BMI 28.53 kg/m2  LMP 03/01/2012   breast exam: No skin changes, or masses, or discharge noted today.  Exam deferred.  Assessment:  Resolved breast mass  Plan:  Return for annual exam area  Leonard Schwartz M.D.  03/10/2012 5:43 PM

## 2012-03-25 ENCOUNTER — Encounter: Payer: BC Managed Care – PPO | Admitting: Family Medicine

## 2012-03-31 ENCOUNTER — Ambulatory Visit
Admission: RE | Admit: 2012-03-31 | Discharge: 2012-03-31 | Disposition: A | Discharge status: Home / Self Care | Payer: BC Managed Care – PPO | Source: Ambulatory Visit | Attending: Obstetrics and Gynecology | Admitting: Obstetrics and Gynecology

## 2012-03-31 DIAGNOSIS — Z139 Encounter for screening, unspecified: Secondary | ICD-10-CM

## 2012-04-01 IMAGING — MG MM DIGITAL SCREENING BILAT
8 series · 8 of 24 positions shown · non-contrast
Comparison: Previous exams.

CLINICAL DATA: Screening.

DIGITAL BILATERAL SCREENING MAMMOGRAM WITH CAD
DIGITAL BREAST TOMOSYNTHESIS
Digital breast tomosynthesis images are acquired in two
projections.  These images are reviewed in combination with the
digital mammogram, confirming the findings below.

[L CC]
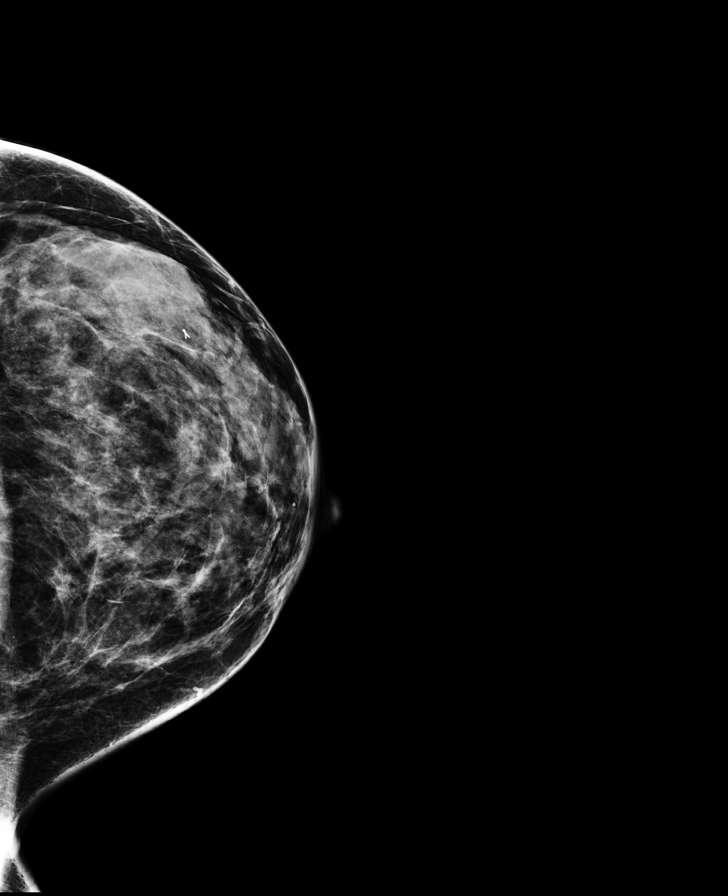

[R MLO]
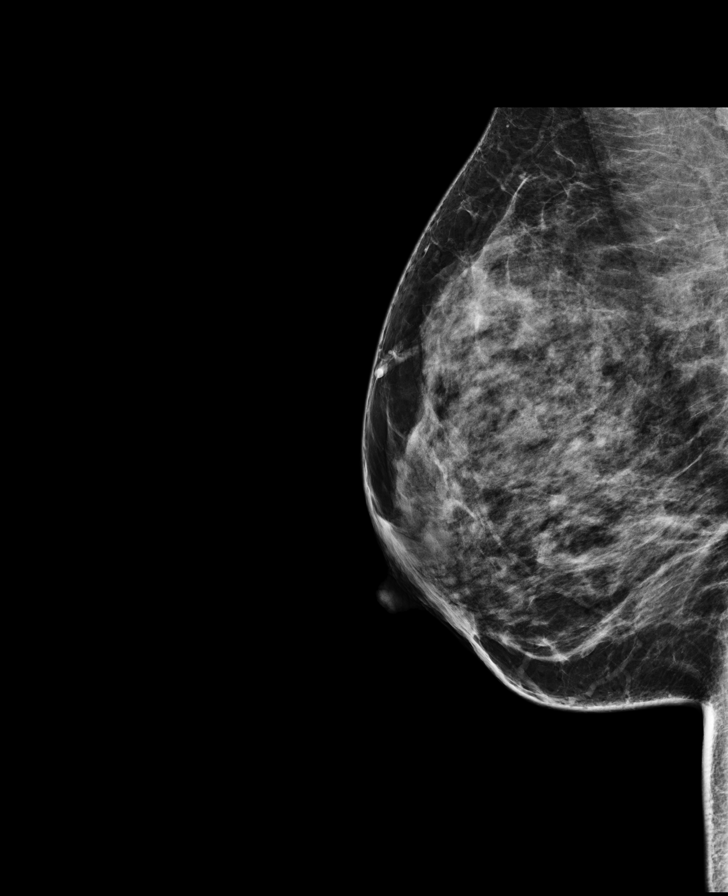

[R CC]
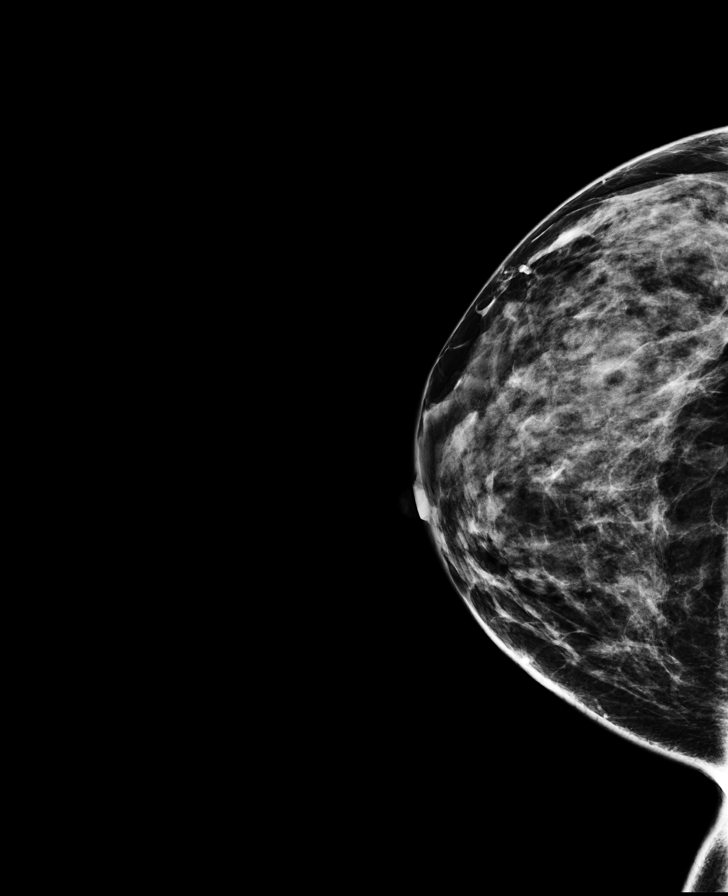

[L MLO]
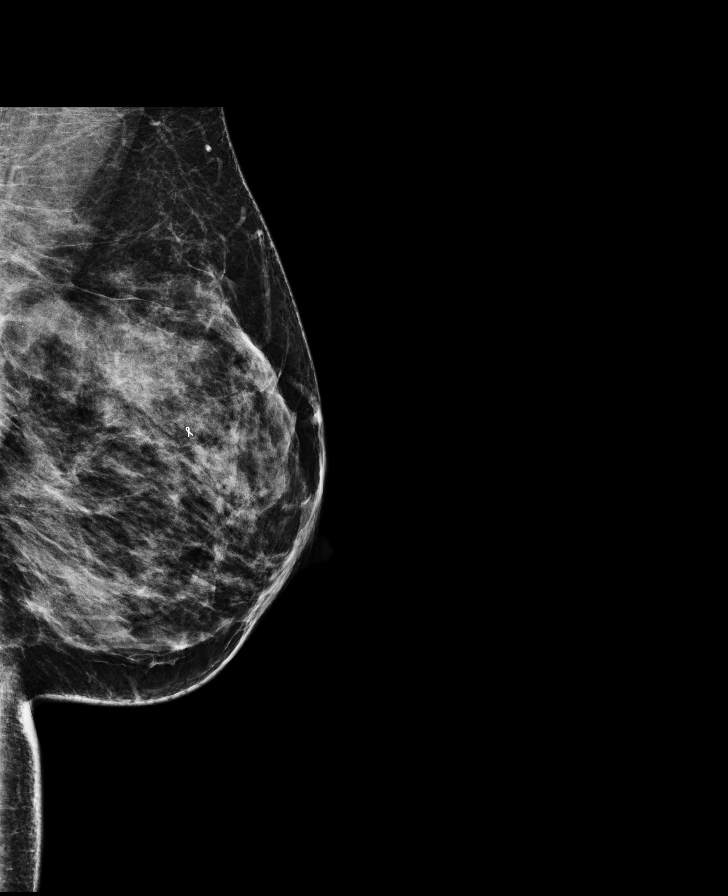

[R MLO tomo · tomo slice 35/68.0]
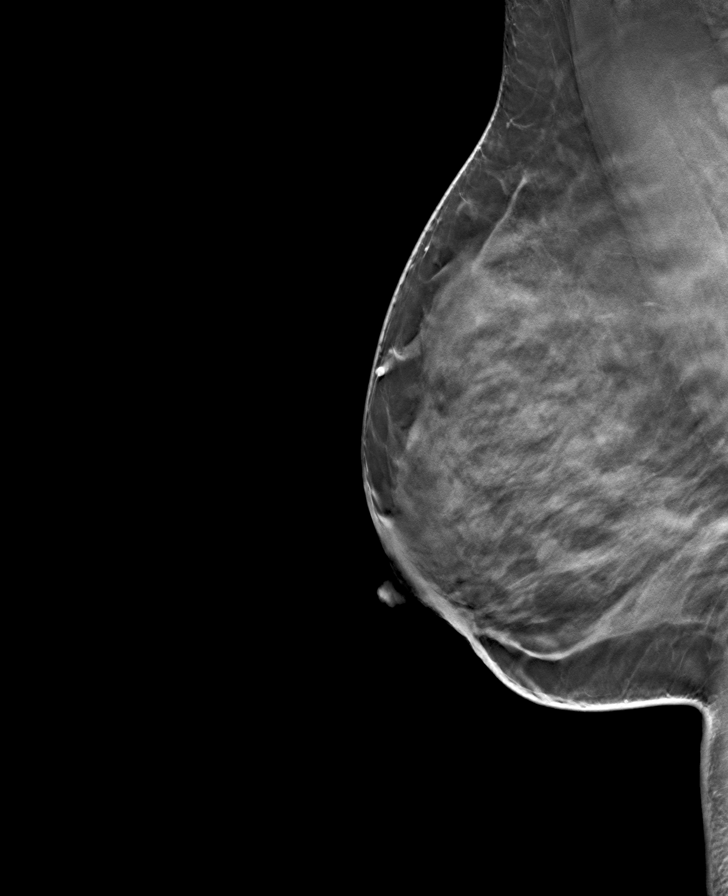

[L MLO tomo · tomo slice 37/73.0]
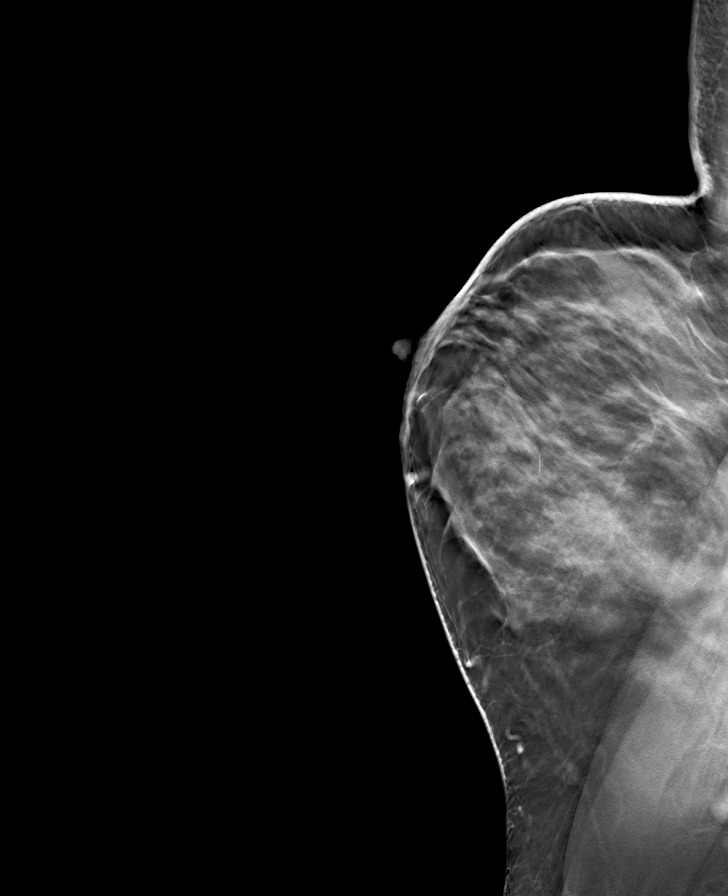

[R CC tomo · tomo slice 33/66.0]
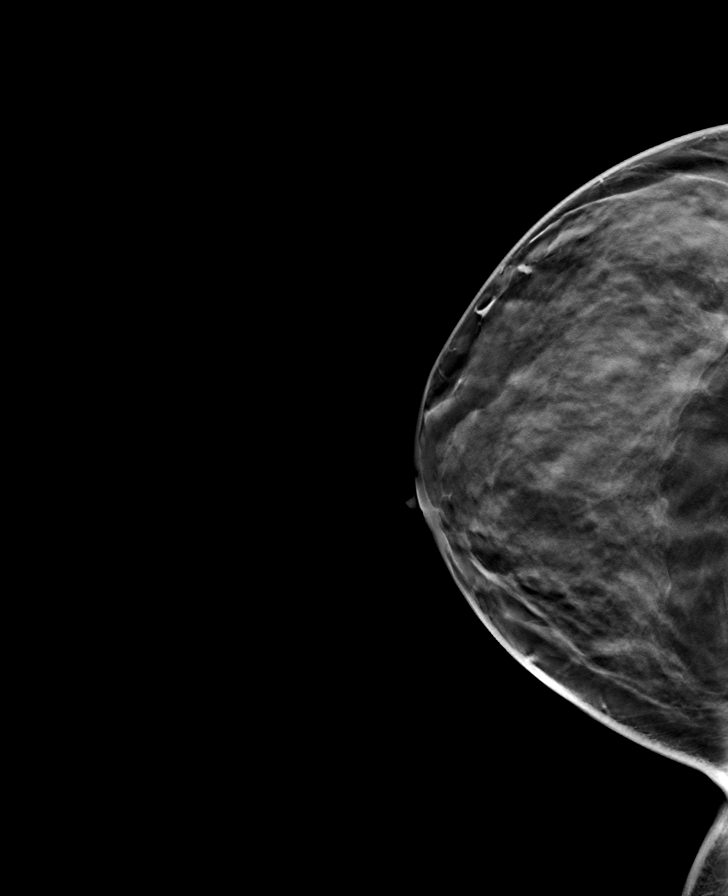

[L CC tomo · tomo slice 34/67.0]
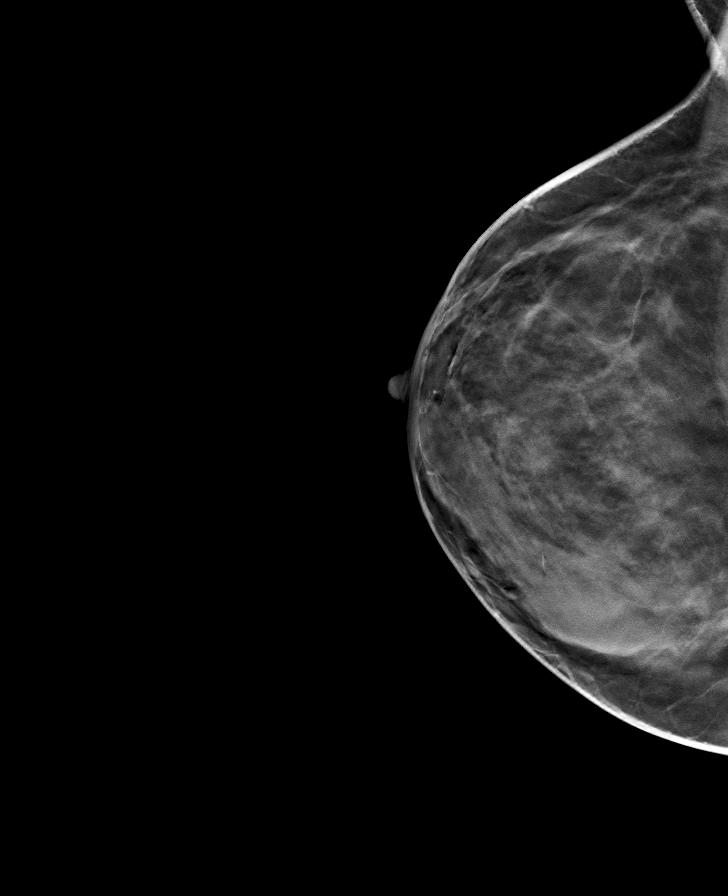

[8 of 24 positions shown; findings below may reference images not displayed]

FINDINGS: ACR Breast Density Category 3: The breast tissue is heterogeneously
dense.

No suspicious masses, architectural distortion, or calcifications
are present.

Images were processed with CAD.
IMPRESSION: No mammographic evidence of malignancy.

A result letter of this screening mammogram will be mailed directly
to the patient.

RECOMMENDATION:
Screening mammogram in one year. (Code:76-4-KYD)

BI-RADS CATEGORY 1:  Negative.

## 2013-02-20 ENCOUNTER — Other Ambulatory Visit: Payer: Self-pay | Admitting: Obstetrics and Gynecology

## 2013-02-21 ENCOUNTER — Other Ambulatory Visit: Payer: Self-pay

## 2013-02-21 DIAGNOSIS — Z1231 Encounter for screening mammogram for malignant neoplasm of breast: Secondary | ICD-10-CM

## 2013-02-24 ENCOUNTER — Encounter (HOSPITAL_COMMUNITY): Payer: Self-pay | Admitting: Pharmacist

## 2013-03-03 ENCOUNTER — Other Ambulatory Visit: Payer: Self-pay | Admitting: Obstetrics and Gynecology

## 2013-03-06 ENCOUNTER — Encounter (HOSPITAL_COMMUNITY)
Admission: RE | Admit: 2013-03-06 | Discharge: 2013-03-06 | Disposition: A | Discharge status: Home / Self Care | Payer: BC Managed Care – PPO | Source: Ambulatory Visit | Attending: Obstetrics and Gynecology | Admitting: Obstetrics and Gynecology

## 2013-03-06 ENCOUNTER — Encounter (HOSPITAL_COMMUNITY): Payer: Self-pay

## 2013-03-06 HISTORY — DX: Type 2 diabetes mellitus without complications: E11.9

## 2013-03-06 HISTORY — DX: Headache: R51

## 2013-03-06 HISTORY — DX: Asymptomatic varicose veins of unspecified lower extremity: I83.90

## 2013-03-06 LAB — BASIC METABOLIC PANEL
BUN: 11 mg/dL (ref 6–23)
CALCIUM: 9.3 mg/dL (ref 8.4–10.5)
CO2: 25 mEq/L (ref 19–32)
Chloride: 103 mEq/L (ref 96–112)
Creatinine, Ser: 0.67 mg/dL (ref 0.50–1.10)
Glucose, Bld: 139 mg/dL — ABNORMAL HIGH (ref 70–99)
Potassium: 4 mEq/L (ref 3.7–5.3)
Sodium: 139 mEq/L (ref 137–147)

## 2013-03-06 LAB — CBC
HCT: 36.5 % (ref 36.0–46.0)
Hemoglobin: 12.1 g/dL (ref 12.0–15.0)
MCH: 27.4 pg (ref 26.0–34.0)
MCHC: 33.2 g/dL (ref 30.0–36.0)
MCV: 82.8 fL (ref 78.0–100.0)
Platelets: 285 K/uL (ref 150–400)
RBC: 4.41 MIL/uL (ref 3.87–5.11)
RDW: 13.5 % (ref 11.5–15.5)
WBC: 3.7 K/uL — ABNORMAL LOW (ref 4.0–10.5)

## 2013-03-06 NOTE — Patient Instructions (Addendum)
   Your procedure is scheduled on:  Thursday, Feb 26  Enter through the [employer] [facility] at:  8 AM Pick up the phone at the desk and dial [phone] and inform us of your arrival.  Please call this number if you have any problems the morning of surgery: [phone]  Remember: Do not eat or drink after midnight: Wednesday Take these medicines the morning of surgery with a SIP OF WATER:  Enalapril, birth control   Do not wear jewelry, make-up, or FINGER nail polish No metal in your hair or on your body. Do not wear lotions, powders, perfumes.  You may wear deodorant.  Do not bring valuables to the hospital. Contacts, dentures or bridgework may not be worn into surgery.  Leave suitcase in the car. After Surgery it may be brought to your room. For patients being admitted to the hospital, checkout time is 11:00am the day of discharge.  Home with husband Clif cell [phone].

## 2013-03-08 NOTE — H&P (Signed)
Admission History and Physical Exam for a Gynecology Patient  [name] is a 43 y.o. female, G1P1001, who presents for laparoscopy assisted vaginal hysterectomy and possible total of bowel hysterectomy. She has been followed at the [facility]. The patient has a known history of fibroids and menorrhagia and anemia. An ultrasound showed a 13.7 cm multi-fibroid uterus with the largest fibroid measuring approximately 7.5 cm. Her endometrial biopsy is negative. Her Pap smear was negative within the last year. Hormonal options have not relieved her discomfort.  OB History   Grav Para Term Preterm Abortions TAB SAB Ect Mult Living   1 1 1       1       Past Medical History  Diagnosis Date  . Lactose intolerance   . Hx: UTI (urinary tract infection) 1997  . Hx gestational diabetes 2000    GDM with pregnancy, after delivery it did not go away  . H/O herpes simplex type 2 infection   . H/O amenorrhea 2001  . H/O dysmenorrhea 2005  . Breakthrough bleeding on birth control pills 06/02/2006    history  . Glucosuria 08/30/07  . H/O vaginal discharge 2010  . Spotting 2011    history  . Irregular periods/menstrual cycles 2011  . Endometrial polyp 09/30/2009  . Diabetes mellitus without complication     tx - enalapril type II  . SVD (spontaneous vaginal delivery)     x 1  . Varicose veins   . Headache(784.0)     otc med prn    No prescriptions prior to admission    Past Surgical History  Procedure Laterality Date  . Lump removed from right breast  1990  . Breast surgery      right side x 3  . Wisdom tooth extraction    . Cholecystectomy  2005  . Dilation and curettage of uterus      polyp    Allergies  Allergen Reactions  . Milk-Related Compounds     Gas, bloating    Family History: family history includes Alcohol abuse in her father; Cancer in her father, mother, paternal aunt, and paternal  uncle; Diabetes in her father, maternal grandmother, mother, and sister; Drug abuse in her sister; Hypertension in her mother and sister; Learning disabilities in her sister.  Social History:  reports that she has never smoked. She has never used smokeless tobacco. She reports that she does not drink alcohol or use illicit drugs.  Review of systems: See HPI.  Admission Physical Exam:    There is no weight on file to calculate BMI.  There were no vitals taken for this visit.  HEENT:                 Within normal limits Chest:                   Clear Heart:                    Regular rate and rhythm Breasts:                No masses, skin changes, bleeding, or discharge present Abdomen:             Nontender, no masses Extremities:          Grossly normal Neurologic exam: Grossly normal  Pelvic exam:  External genitalia: normal general appearance Vaginal: normal without tenderness, induration or masses Cervix:  normal appearance Adnexa: normal bimanual exam Uterus: 14 week size Rectal: good sphincter tone  CBC    Component Value Date/Time   WBC 3.7* 03/06/2013 1055   RBC 4.41 03/06/2013 1055   HGB 12.1 03/06/2013 1055   HCT 36.5 03/06/2013 1055   PLT 285 03/06/2013 1055   MCV 82.8 03/06/2013 1055   MCH 27.4 03/06/2013 1055   MCHC 33.2 03/06/2013 1055   RDW 13.5 03/06/2013 1055    Assessment:  14 week size fibroid  Menorrhagia  Plan:  The patient will undergo a laparoscopy assisted vaginal hysterectomy. She understands that a total abdominal hysterectomy may be needed. She understands the indications for her surgical procedure as well as the alternative treatment options. She accepts the risk of, but not limited to, anesthetic complications, bleeding, infections, and possible damage to the surrounding organs.   Eli Hose 03/08/2013

## 2013-03-09 ENCOUNTER — Inpatient Hospital Stay (HOSPITAL_COMMUNITY): Payer: BC Managed Care – PPO | Admitting: Anesthesiology

## 2013-03-09 ENCOUNTER — Encounter (HOSPITAL_COMMUNITY): Payer: BC Managed Care – PPO | Admitting: Anesthesiology

## 2013-03-09 ENCOUNTER — Encounter (HOSPITAL_COMMUNITY)
Admission: RE | Disposition: A | Discharge status: Home / Self Care | Payer: Self-pay | Source: Ambulatory Visit | Attending: Obstetrics and Gynecology

## 2013-03-09 ENCOUNTER — Encounter (HOSPITAL_COMMUNITY): Payer: Self-pay | Admitting: Anesthesiology

## 2013-03-09 ENCOUNTER — Observation Stay (HOSPITAL_COMMUNITY)
Admission: RE | Admit: 2013-03-09 | Discharge: 2013-03-10 | Disposition: A | Discharge status: Home / Self Care | Payer: BC Managed Care – PPO | Source: Ambulatory Visit | Attending: Obstetrics and Gynecology | Admitting: Obstetrics and Gynecology

## 2013-03-09 DIAGNOSIS — I2510 Atherosclerotic heart disease of native coronary artery without angina pectoris: Secondary | ICD-10-CM | POA: Insufficient documentation

## 2013-03-09 DIAGNOSIS — I509 Heart failure, unspecified: Secondary | ICD-10-CM | POA: Insufficient documentation

## 2013-03-09 DIAGNOSIS — L299 Pruritus, unspecified: Secondary | ICD-10-CM | POA: Insufficient documentation

## 2013-03-09 DIAGNOSIS — D251 Intramural leiomyoma of uterus: Secondary | ICD-10-CM | POA: Insufficient documentation

## 2013-03-09 DIAGNOSIS — D252 Subserosal leiomyoma of uterus: Secondary | ICD-10-CM | POA: Insufficient documentation

## 2013-03-09 DIAGNOSIS — D250 Submucous leiomyoma of uterus: Secondary | ICD-10-CM | POA: Insufficient documentation

## 2013-03-09 DIAGNOSIS — D649 Anemia, unspecified: Secondary | ICD-10-CM | POA: Insufficient documentation

## 2013-03-09 DIAGNOSIS — Z90710 Acquired absence of both cervix and uterus: Secondary | ICD-10-CM | POA: Diagnosis not present

## 2013-03-09 DIAGNOSIS — I10 Essential (primary) hypertension: Secondary | ICD-10-CM | POA: Insufficient documentation

## 2013-03-09 DIAGNOSIS — N920 Excessive and frequent menstruation with regular cycle: Principal | ICD-10-CM | POA: Insufficient documentation

## 2013-03-09 DIAGNOSIS — D219 Benign neoplasm of connective and other soft tissue, unspecified: Secondary | ICD-10-CM | POA: Diagnosis present

## 2013-03-09 DIAGNOSIS — E119 Type 2 diabetes mellitus without complications: Secondary | ICD-10-CM | POA: Insufficient documentation

## 2013-03-09 HISTORY — PX: ABDOMINAL HYSTERECTOMY: SHX81

## 2013-03-09 LAB — GLUCOSE, CAPILLARY
GLUCOSE-CAPILLARY: 136 mg/dL — AB (ref 70–99)
Glucose-Capillary: 179 mg/dL — ABNORMAL HIGH (ref 70–99)

## 2013-03-09 LAB — PREGNANCY, URINE: PREG TEST UR: NEGATIVE

## 2013-03-09 SURGERY — HYSTERECTOMY, ABDOMINAL
Anesthesia: Regional

## 2013-03-09 MED ORDER — ENALAPRIL MALEATE 5 MG PO TABS
5.0000 mg | ORAL_TABLET | Freq: Every day | ORAL | Status: DC
  Administered 2013-03-10: 5 mg via ORAL
  Filled 2013-03-09: qty 1

## 2013-03-09 MED ORDER — LIDOCAINE 1%/NA BICARB 0.1 MEQ INJECTION
INJECTION | INTRAVENOUS | Status: AC
  Filled 2013-03-09: qty 1

## 2013-03-09 MED ORDER — SODIUM CHLORIDE 0.9 % IJ SOLN
INTRAMUSCULAR | Status: AC
  Filled 2013-03-09: qty 100

## 2013-03-09 MED ORDER — METHYLENE BLUE 1 % INJ SOLN
INTRAMUSCULAR | Status: DC | PRN
  Administered 2013-03-09: 50 mg via INTRAVENOUS

## 2013-03-09 MED ORDER — PROMETHAZINE HCL 25 MG PO TABS: 12.5000 mg | ORAL_TABLET | Freq: Four times a day (QID) | ORAL | Status: DC | PRN

## 2013-03-09 MED ORDER — PROPOFOL 10 MG/ML IV BOLUS
INTRAVENOUS | Status: DC | PRN
  Administered 2013-03-09: 150 mg via INTRAVENOUS

## 2013-03-09 MED ORDER — NEOSTIGMINE METHYLSULFATE 1 MG/ML IJ SOLN
INTRAMUSCULAR | Status: DC | PRN
  Administered 2013-03-09: 3 mg via INTRAVENOUS

## 2013-03-09 MED ORDER — LACTATED RINGERS IV SOLN
INTRAVENOUS | Status: DC
  Administered 2013-03-09 (×5): via INTRAVENOUS

## 2013-03-09 MED ORDER — LACTATED RINGERS IV SOLN
INTRAVENOUS | Status: DC
  Administered 2013-03-09 – 2013-03-10 (×2): via INTRAVENOUS

## 2013-03-09 MED ORDER — GLYBURIDE 2.5 MG PO TABS
2.5000 mg | ORAL_TABLET | Freq: Two times a day (BID) | ORAL | Status: DC
  Filled 2013-03-09 (×2): qty 1

## 2013-03-09 MED ORDER — DIPHENHYDRAMINE HCL 50 MG/ML IJ SOLN
12.5000 mg | Freq: Four times a day (QID) | INTRAMUSCULAR | Status: DC | PRN
  Administered 2013-03-09: 12.5 mg via INTRAVENOUS
  Filled 2013-03-09: qty 1

## 2013-03-09 MED ORDER — ONDANSETRON HCL 4 MG/2ML IJ SOLN
INTRAMUSCULAR | Status: DC | PRN
  Administered 2013-03-09: 4 mg via INTRAVENOUS

## 2013-03-09 MED ORDER — FENTANYL CITRATE 0.05 MG/ML IJ SOLN
INTRAMUSCULAR | Status: DC | PRN
  Administered 2013-03-09: 50 ug via INTRAVENOUS
  Administered 2013-03-09 (×2): 100 ug via INTRAVENOUS
  Administered 2013-03-09: 50 ug via INTRAVENOUS
  Administered 2013-03-09 (×2): 100 ug via INTRAVENOUS

## 2013-03-09 MED ORDER — DOCUSATE SODIUM 100 MG PO CAPS
100.0000 mg | ORAL_CAPSULE | Freq: Two times a day (BID) | ORAL | Status: DC
  Administered 2013-03-09 – 2013-03-10 (×2): 100 mg via ORAL
  Filled 2013-03-09 (×2): qty 1

## 2013-03-09 MED ORDER — IBUPROFEN 600 MG PO TABS: 600.0000 mg | ORAL_TABLET | Freq: Four times a day (QID) | ORAL | Status: DC | PRN

## 2013-03-09 MED ORDER — SODIUM CHLORIDE 0.9 % IJ SOLN: 9.0000 mL | INTRAMUSCULAR | Status: DC | PRN

## 2013-03-09 MED ORDER — LACTATED RINGERS IV SOLN: INTRAVENOUS | Status: DC

## 2013-03-09 MED ORDER — DEXAMETHASONE SODIUM PHOSPHATE 10 MG/ML IJ SOLN
INTRAMUSCULAR | Status: DC | PRN
  Administered 2013-03-09: 10 mg via INTRAVENOUS

## 2013-03-09 MED ORDER — ACETAMINOPHEN 325 MG PO TABS: 325.0000 mg | ORAL_TABLET | ORAL | Status: DC | PRN

## 2013-03-09 MED ORDER — NALOXONE HCL 0.4 MG/ML IJ SOLN: 0.4000 mg | INTRAMUSCULAR | Status: DC | PRN

## 2013-03-09 MED ORDER — PNEUMOCOCCAL VAC POLYVALENT 25 MCG/0.5ML IJ INJ
0.5000 mL | INJECTION | INTRAMUSCULAR | Status: AC
  Administered 2013-03-10: 0.5 mL via INTRAMUSCULAR
  Filled 2013-03-09: qty 0.5

## 2013-03-09 MED ORDER — CEFAZOLIN SODIUM-DEXTROSE 2-3 GM-% IV SOLR
INTRAVENOUS | Status: AC
  Filled 2013-03-09: qty 50

## 2013-03-09 MED ORDER — VASOPRESSIN 20 UNIT/ML IJ SOLN
INTRAMUSCULAR | Status: AC
  Filled 2013-03-09: qty 1

## 2013-03-09 MED ORDER — DIPHENHYDRAMINE HCL 12.5 MG/5ML PO ELIX
12.5000 mg | ORAL_SOLUTION | Freq: Four times a day (QID) | ORAL | Status: DC | PRN
Start: 2013-03-09 — End: 2013-03-10

## 2013-03-09 MED ORDER — KETOROLAC TROMETHAMINE 30 MG/ML IJ SOLN: 15.0000 mg | Freq: Once | INTRAMUSCULAR | Status: DC | PRN

## 2013-03-09 MED ORDER — METHYLENE BLUE 1 % INJ SOLN
INTRAMUSCULAR | Status: AC
  Filled 2013-03-09: qty 10

## 2013-03-09 MED ORDER — ROCURONIUM BROMIDE 100 MG/10ML IV SOLN
INTRAVENOUS | Status: DC | PRN
  Administered 2013-03-09: 10 mg via INTRAVENOUS
  Administered 2013-03-09: 5 mg via INTRAVENOUS
  Administered 2013-03-09 (×2): 10 mg via INTRAVENOUS
  Administered 2013-03-09: 50 mg via INTRAVENOUS

## 2013-03-09 MED ORDER — HYDROMORPHONE HCL PF 1 MG/ML IJ SOLN
INTRAMUSCULAR | Status: DC | PRN
  Administered 2013-03-09: 1 mg via INTRAVENOUS

## 2013-03-09 MED ORDER — HYDROMORPHONE 0.3 MG/ML IV SOLN
INTRAVENOUS | Status: DC
  Administered 2013-03-09: 17:00:00 via INTRAVENOUS
  Administered 2013-03-09: 2.9 mg via INTRAVENOUS
  Administered 2013-03-10: 0.6 mg via INTRAVENOUS
  Administered 2013-03-10: 0.3 mg via INTRAVENOUS
  Filled 2013-03-09: qty 25

## 2013-03-09 MED ORDER — KETOROLAC TROMETHAMINE 30 MG/ML IJ SOLN
INTRAMUSCULAR | Status: DC | PRN
  Administered 2013-03-09: 30 mg via INTRAVENOUS

## 2013-03-09 MED ORDER — GLYCOPYRROLATE 0.2 MG/ML IJ SOLN
INTRAMUSCULAR | Status: DC | PRN
  Administered 2013-03-09: 0.6 mg via INTRAVENOUS

## 2013-03-09 MED ORDER — OXYCODONE-ACETAMINOPHEN 5-325 MG PO TABS
1.0000 | ORAL_TABLET | ORAL | Status: DC | PRN
  Administered 2013-03-10: 1 via ORAL
  Filled 2013-03-09: qty 1

## 2013-03-09 MED ORDER — BUPIVACAINE-EPINEPHRINE 0.5% -1:200000 IJ SOLN
INTRAMUSCULAR | Status: DC | PRN
  Administered 2013-03-09: 8 mL

## 2013-03-09 MED ORDER — BUPIVACAINE-EPINEPHRINE (PF) 0.5% -1:200000 IJ SOLN
INTRAMUSCULAR | Status: AC
  Filled 2013-03-09: qty 10

## 2013-03-09 MED ORDER — LIDOCAINE HCL (CARDIAC) 20 MG/ML IV SOLN
INTRAVENOUS | Status: DC | PRN
  Administered 2013-03-09: 60 mg via INTRAVENOUS

## 2013-03-09 MED ORDER — MENTHOL 3 MG MT LOZG
1.0000 | LOZENGE | OROMUCOSAL | Status: DC | PRN
Start: 2013-03-09 — End: 2013-03-10

## 2013-03-09 MED ORDER — ONDANSETRON HCL 4 MG/2ML IJ SOLN
4.0000 mg | Freq: Four times a day (QID) | INTRAMUSCULAR | Status: DC | PRN
Start: 2013-03-09 — End: 2013-03-10

## 2013-03-09 MED ORDER — KETOROLAC TROMETHAMINE 30 MG/ML IJ SOLN
30.0000 mg | Freq: Four times a day (QID) | INTRAMUSCULAR | Status: AC
  Administered 2013-03-09 – 2013-03-10 (×3): 30 mg via INTRAVENOUS
  Filled 2013-03-09 (×3): qty 1

## 2013-03-09 MED ORDER — VASOPRESSIN 20 UNIT/ML IJ SOLN
INTRAVENOUS | Status: DC | PRN
  Administered 2013-03-09: 12:00:00 via INTRAMUSCULAR

## 2013-03-09 MED ORDER — FENTANYL CITRATE 0.05 MG/ML IJ SOLN
INTRAMUSCULAR | Status: AC
  Filled 2013-03-09: qty 2

## 2013-03-09 MED ORDER — FENTANYL CITRATE 0.05 MG/ML IJ SOLN
INTRAMUSCULAR | Status: AC
  Filled 2013-03-09: qty 5

## 2013-03-09 MED ORDER — MIDAZOLAM HCL 5 MG/5ML IJ SOLN
INTRAMUSCULAR | Status: DC | PRN
  Administered 2013-03-09: 2 mg via INTRAVENOUS

## 2013-03-09 MED ORDER — KETOROLAC TROMETHAMINE 60 MG/2ML IM SOLN
INTRAMUSCULAR | Status: DC | PRN
  Administered 2013-03-09: 30 mg via INTRAMUSCULAR

## 2013-03-09 MED ORDER — ONDANSETRON HCL 4 MG/2ML IJ SOLN: 4.0000 mg | Freq: Once | INTRAMUSCULAR | Status: DC | PRN

## 2013-03-09 MED ORDER — FENTANYL CITRATE 0.05 MG/ML IJ SOLN
25.0000 ug | INTRAMUSCULAR | Status: DC | PRN
  Administered 2013-03-09: 50 ug via INTRAVENOUS

## 2013-03-09 MED ORDER — SODIUM CHLORIDE 0.9 % IJ SOLN
INTRAMUSCULAR | Status: AC
  Filled 2013-03-09: qty 10

## 2013-03-09 MED ORDER — CEFAZOLIN SODIUM-DEXTROSE 2-3 GM-% IV SOLR
2.0000 g | INTRAVENOUS | Status: AC
  Administered 2013-03-09: 2 g via INTRAVENOUS

## 2013-03-09 MED ORDER — ACETAMINOPHEN 160 MG/5ML PO SOLN: 325.0000 mg | ORAL | Status: DC | PRN

## 2013-03-09 SURGICAL SUPPLY — 55 items
CANISTER SUCT 3000ML (MISCELLANEOUS) ×2 IMPLANT
CATH FOLEY 3WAY 30CC 16FR (CATHETERS) IMPLANT
CHLORAPREP W/TINT 26ML (MISCELLANEOUS) ×2 IMPLANT
CLOTH BEACON ORANGE TIMEOUT ST (SAFETY) ×2 IMPLANT
CONT PATH 16OZ SNAP LID 3702 (MISCELLANEOUS) ×2 IMPLANT
CORD ACTIVE DISPOSABLE (ELECTRODE) ×1
CORD ELECTRO ACTIVE DISP (ELECTRODE) ×1 IMPLANT
DECANTER SPIKE VIAL GLASS SM (MISCELLANEOUS) IMPLANT
DERMABOND ADHESIVE PROPEN (GAUZE/BANDAGES/DRESSINGS) ×1
DERMABOND ADVANCED .7 DNX6 (GAUZE/BANDAGES/DRESSINGS) ×1 IMPLANT
DRAPE CESAREAN BIRTH W POUCH (DRAPES) IMPLANT
DRAPE WARM FLUID 44X44 (DRAPE) IMPLANT
FILTER SMOKE EVAC LAPAROSHD (FILTER) ×2 IMPLANT
FORMULA 20CAL 3 OZ MEAD (FORMULA) IMPLANT
GAUZE SPONGE 4X4 16PLY XRAY LF (GAUZE/BANDAGES/DRESSINGS) ×2 IMPLANT
GLOVE BIOGEL PI IND STRL 8.5 (GLOVE) ×1 IMPLANT
GLOVE BIOGEL PI INDICATOR 8.5 (GLOVE) ×1
GLOVE ECLIPSE 8.0 STRL XLNG CF (GLOVE) ×4 IMPLANT
GOWN STRL REUS W/ TWL XL LVL3 (GOWN DISPOSABLE) ×1 IMPLANT
GOWN STRL REUS W/TWL 2XL LVL3 (GOWN DISPOSABLE) ×2 IMPLANT
GOWN STRL REUS W/TWL LRG LVL3 (GOWN DISPOSABLE) ×2 IMPLANT
GOWN STRL REUS W/TWL XL LVL3 (GOWN DISPOSABLE) ×1
NEEDLE HYPO 18GX1.5 BLUNT FILL (NEEDLE) IMPLANT
NEEDLE HYPO 25X1 1.5 SAFETY (NEEDLE) IMPLANT
NEEDLE MAYO .5 CIRCLE (NEEDLE) ×2 IMPLANT
NS IRRIG 1000ML POUR BTL (IV SOLUTION) ×2 IMPLANT
PACK ABDOMINAL GYN (CUSTOM PROCEDURE TRAY) ×2 IMPLANT
PAD OB MATERNITY 4.3X12.25 (PERSONAL CARE ITEMS) ×2 IMPLANT
PLUG CATH AND CAP STER (CATHETERS) IMPLANT
PROTECTOR NERVE ULNAR (MISCELLANEOUS) ×2 IMPLANT
SET IRRIG TUBING LAPAROSCOPIC (IRRIGATION / IRRIGATOR) ×2 IMPLANT
SOLUTION ELECTROLUBE (MISCELLANEOUS) ×2 IMPLANT
SPONGE LAP 18X18 X RAY DECT (DISPOSABLE) ×4 IMPLANT
STAPLER VISISTAT 35W (STAPLE) ×2 IMPLANT
SUT CHROMIC 3 0 SH 27 (SUTURE) ×2 IMPLANT
SUT MNCRL AB 3-0 PS2 27 (SUTURE) ×2 IMPLANT
SUT PDS AB 1 CTX 36 (SUTURE) IMPLANT
SUT VIC AB 0 CT1 18XCR BRD8 (SUTURE) ×3 IMPLANT
SUT VIC AB 0 CT1 27 (SUTURE) ×4
SUT VIC AB 0 CT1 27XBRD ANBCTR (SUTURE) ×4 IMPLANT
SUT VIC AB 0 CT1 8-18 (SUTURE) ×3
SUT VIC AB 2-0 CT1 27 (SUTURE)
SUT VIC AB 2-0 CT1 TAPERPNT 27 (SUTURE) IMPLANT
SUT VIC AB 2-0 SH 27 (SUTURE) ×1
SUT VIC AB 2-0 SH 27XBRD (SUTURE) ×1 IMPLANT
SUT VIC AB 3-0 CT1 27 (SUTURE) ×1
SUT VIC AB 3-0 CT1 TAPERPNT 27 (SUTURE) ×1 IMPLANT
SUT VIC AB 4-0 PS2 27 (SUTURE) IMPLANT
SUT VICRYL 0 TIES 12 18 (SUTURE) ×2 IMPLANT
SUT VICRYL 0 UR6 27IN ABS (SUTURE) ×4 IMPLANT
SYR 30ML LL (SYRINGE) IMPLANT
SYR CONTROL 10ML LL (SYRINGE) IMPLANT
TOWEL OR 17X24 6PK STRL BLUE (TOWEL DISPOSABLE) ×4 IMPLANT
TRAY FOLEY CATH 14FR (SET/KITS/TRAYS/PACK) ×2 IMPLANT
WATER STERILE IRR 1000ML POUR (IV SOLUTION) ×2 IMPLANT

## 2013-03-09 NOTE — Op Note (Signed)
OPERATIVE NOTE  Susan Wilkerson  DOB:    [birth year]  MRN:    [id number]  CSN:    [id number]  Date of Surgery:  03/09/2013  Preoperative Diagnosis:  Menorrhagia  Fibroids  History of anemia  Diabetes  Pelvic pressure symptoms  Postoperative Diagnosis:  Same  Procedure:  Laparoscopy Assisted Vaginal Hysterectomy  Bilateral salpingectomy  Cystoscopy  Surgeon:  Gildardo Cranker, M.D.  Assistant:  Earnstine Regal, PA-C  Anesthetic:  General  Disposition:  The patient presents with the above-mentioned diagnosis. She understands the indications for her surgical procedure.  She also understands the alternative treatment options. She accepts the risk of, but not limited to, anesthetic complications, bleeding, infections, and possible damage to the surrounding organs.  Findings:  The patient had a 525 g multi-fibroid uterus. The fallopian tubes and ovaries appeared normal. The bowel and the upper abdomen appeared normal.  Procedure:  The patient was taken to the operating room where a general anesthetic was given. The patient's  abdomen was prepped with ChloraPrep. The perineum and vagina were prepped with multiple layers of Betadine. A Foley catheter was placed in the bladder. An examination under anesthesia was performed. A Hulka tenaculum was placed inside the uterus. The patient was sterilely draped. The subumbilical area was injected with half percent Marcaine with epinephrine. An incision was made and the Veress needle was inserted into the abdominal cavity without difficulty. Proper placement was confirmed using the saline drop test. A pneumoperitoneum was obtained. The laparoscopic trocar and the laparoscope were substituted for the Veress needle. Operative findings are mentioned above. The mid abdomen was injected with half percent Marcaine with epinephrine to the right of the midline. A small incision was made and a 5 mm trocar was inserted into the abdominal  cavity under direct visualization. An identical procedure was carried out on the opposite side. Pictures were taken of the patient's pelvic structures. The right round ligament was identified. The round ligament was cauterized using the bipolar cautery. The round ligament was then cut. The right fallopian tube was identified and followed to its fimbriated end. The proximal portion of the right fallopian tube was cauterized and cut. The right upper pedicle was then isolated. The right utero-ovarian ligament was cauterized and cut. Bleeding was encountered from her fibroids. The right uterine artery was skeletonized. The bladder flap was developed anteriorly. We then located the left round ligament. The left round ligament was cauterized and cut using the bipolar cautery. The left fallopian tube, left upper pedicle, and the left utero-ovarian ligament were cauterized and cut as we did on the opposite side. The bladder flap was completed using Hydro dissection. At this point we felt we were ready to proceed with the vaginal portion of the procedure. The patient was placed in a more lithotomy position. A weighted speculum was placed in the posterior vagina. The cervix was injected with Pitressin and saline. A circumferential incision was made around the cervix. The vaginal mucosa was advanced anteriorly and posteriorly. The anterior cul-de-sac and in the posterior cul-de-sac were sharply entered. Alternating from right to left the uterosacral ligaments, paracervical tissues, parametrial tissues, and uterine arteries were clamped, cut, sutured, and tied securely. A large fibroid was noted posteriorly. We morcellated the large fibroid until we were able to invert the uterus through the posterior colpotomy.  The uterus was removed from the operative field. Hemostasis was confirmed. The right fallopian tube was isolated and the mesosalpinx was clamped and cut. 2 free ties were  placed for hemostasis. An identical procedure  was cared out on the opposite side. The sutures attached to the uterosacral ligaments were brought out through the vaginal angles and then tied securely. A McCall culdoplasty suture was placed in the posterior cul-de-sac incorporating the uterosacral ligaments bilaterally and the posterior peritoneum. A final check was made for hemostasis and again hemostasis was confirmed. The vaginal cuff was closed using figure-of-eight sutures incorporating the anterior vaginal mucosa, the anterior peritoneum, posterior peritoneum, and the posterior vaginal mucosa. The McCall culdoplasty suture was tied securely and the apex of the vagina was noted to elevate into the midpelvis. A Foley catheter was removed. The cystoscope was inserted. The bladder mucosa was inspected. There was no evidence of damage. No pathology was appreciated. Jets of fluid were noted to pass through both ureteral orifices. The cystoscope was removed. A Foley catheter was reinserted under sterile conditions. The operator then changed gown and gloves. The pneumoperitoneum was reestablished. The pelvis was inspected and hemostasis was adequate. The pelvis was irrigated. We felt that we are ready to end the procedure. The 5 mm trochars were removed under direct visualization. The pneumoperitoneum was allowed to escape. The subumbilical trocar was removed. The subumbilical incision was closed using a deep suture of 0 Vicryl followed by skin closures of 3-0 Monocryl. The patient tolerated her procedure well. She was awakened from her anesthetic without difficulty and then transported to the recovery room in stable condition. Sponge, needle, and instrument counts were correct on 2 occasions. The estimated blood loss was 600 cc's by the anesthesia team but my estimated blood loss was 300 cc's. 0 Vicryl is the suture material used throughout the procedure. The uterus was sent to pathology.   Gildardo Cranker, M.D.

## 2013-03-09 NOTE — Transfer of Care (Signed)
Immediate Anesthesia Transfer of Care Note  Patient: Susan Wilkerson  Procedure(s) Performed: Procedure(s): laporoscopic vaginal hysterectomy (N/A)  Patient Location: PACU  Anesthesia Type:General  Level of Consciousness: sedated  Airway & Oxygen Therapy: Patient Spontanous Breathing and Patient connected to nasal cannula oxygen  Post-op Assessment: Report given to PACU RN and Post -op Vital signs reviewed and stable  Post vital signs: stable  Complications: No apparent anesthesia complications

## 2013-03-09 NOTE — Addendum Note (Signed)
Addendum created 03/09/13 1705 by Asher Muir, CRNA   Modules edited: Notes Section   Notes Section:  File: [id number]

## 2013-03-09 NOTE — Anesthesia Postprocedure Evaluation (Signed)
Anesthesia Post Note  Patient: Susan Wilkerson  Procedure(s) Performed: Procedure(s) (LRB): laporoscopic vaginal hysterectomy (N/A)  Anesthesia type: General  Patient location: [facility]  Post pain: Pain level controlled  Post assessment: Post-op Vital signs reviewed  Last Vitals:  Filed Vitals:   03/09/13 1653  BP:   Pulse:   Temp:   Resp: 18    Post vital signs: Reviewed  Level of consciousness: sedated  Complications: No apparent anesthesia complications

## 2013-03-09 NOTE — Progress Notes (Signed)
Day of Surgery Procedure(s) (LRB): laporoscopic vaginal hysterectomy (N/A)  Subjective: Patient reports tolerating PO.   Pt c/o itching Pain is well controlled  Objective: I have reviewed patient's vital signs. BP 153/85  Pulse 79  Temp(Src) 97.8 F (36.6 C) (Oral)  Resp 18  SpO2 100%   General: alert and cooperative Resp: clear to auscultation bilaterally Cardio: regular rate and rhythm GI: soft, non-tender; bowel sounds normal; no masses,  no organomegaly Extremities: Homans sign is negative, no sign of DVT Vaginal Bleeding: minimal  Assessment: s/p Procedure(s): laporoscopic vaginal hysterectomy (N/A): stable  Plan: Advance diet Encourage ambulation benadryl for itching  LOS: 0 days    Susan Wilkerson A 03/09/2013, 5:12 PM

## 2013-03-09 NOTE — Anesthesia Postprocedure Evaluation (Signed)
  Anesthesia Post-op Note  Patient: Susan Wilkerson  Procedure(s) Performed: Procedure(s): laporoscopic vaginal hysterectomy (N/A) Patient is awake and responsive. Pain and nausea are reasonably well controlled. Vital signs are stable and clinically acceptable. Oxygen saturation is clinically acceptable. There are no apparent anesthetic complications at this time. Patient is ready for discharge.

## 2013-03-09 NOTE — Anesthesia Preprocedure Evaluation (Signed)
Anesthesia Evaluation  Patient identified by MRN, date of birth, ID band Patient awake    Reviewed: Allergy & Precautions  History of Anesthesia Complications Negative for: history of anesthetic complications  Airway Mallampati: I TM Distance: >3 FB Neck ROM: Full    Dental  (+) Teeth Intact   Pulmonary neg pulmonary ROS,    Pulmonary exam normal       Cardiovascular hypertension, Pt. on medications - CAD, - Past MI, - CHF and - DOE Rhythm:Regular Rate:Normal     Neuro/Psych  Headaches, negative neurological ROS  negative psych ROS   GI/Hepatic negative GI ROS, Neg liver ROS,   Endo/Other  Elevated bg, no diagnosis of diabetes  Renal/GU negative Renal ROS  negative genitourinary   Musculoskeletal negative musculoskeletal ROS (+)   Abdominal   Peds  Hematology negative hematology ROS (+)   Anesthesia Other Findings   Reproductive/Obstetrics                           Anesthesia Physical Anesthesia Plan  ASA: II  Anesthesia Plan: General and Regional   Post-op Pain Management:    Induction: Intravenous  Airway Management Planned: Oral ETT and LMA  Additional Equipment: None  Intra-op Plan:   Post-operative Plan: Extubation in OR  Informed Consent: I have reviewed the patients History and Physical, chart, labs and discussed the procedure including the risks, benefits and alternatives for the proposed anesthesia with the patient or authorized representative who has indicated his/her understanding and acceptance.   Dental advisory given  Plan Discussed with: CRNA and Surgeon  Anesthesia Plan Comments:         Anesthesia Quick Evaluation

## 2013-03-09 NOTE — H&P (Signed)
The patient was interviewed and examined today.  The previously documented history and physical examination was reviewed. There are no changes. The operative procedure was reviewed. The risks and benefits were outlined again. The specific risks include, but are not limited to, anesthetic complications, bleeding, infections, and possible damage to the surrounding organs. The patient's questions were answered.  We are ready to proceed as outlined. The likelihood of the patient achieving the goals of this procedure is very likely.   BP 108/53  Pulse 100  Temp(Src) 98.2 F (36.8 C) (Oral)  Resp 20  SpO2 98%  Results for orders placed during the hospital encounter of 03/09/13 (from the past 24 hour(s))  PREGNANCY, URINE     Status: None   Collection Time    03/09/13  7:24 AM      Result Value Ref Range   Preg Test, Ur NEGATIVE  NEGATIVE  GLUCOSE, CAPILLARY     Status: Abnormal   Collection Time    03/09/13  7:45 AM      Result Value Ref Range   Glucose-Capillary 136 (*) 70 - 99 mg/dL   Gildardo Cranker, M.D.

## 2013-03-10 ENCOUNTER — Encounter (HOSPITAL_COMMUNITY): Payer: Self-pay | Admitting: Obstetrics and Gynecology

## 2013-03-10 LAB — CBC
HCT: 31 % — ABNORMAL LOW (ref 36.0–46.0)
Hemoglobin: 10.2 g/dL — ABNORMAL LOW (ref 12.0–15.0)
MCH: 27.3 pg (ref 26.0–34.0)
MCHC: 32.9 g/dL (ref 30.0–36.0)
MCV: 82.9 fL (ref 78.0–100.0)
PLATELETS: 243 10*3/uL (ref 150–400)
RBC: 3.74 MIL/uL — ABNORMAL LOW (ref 3.87–5.11)
RDW: 13.3 % (ref 11.5–15.5)
WBC: 12.6 10*3/uL — ABNORMAL HIGH (ref 4.0–10.5)

## 2013-03-10 LAB — GLUCOSE, CAPILLARY
GLUCOSE-CAPILLARY: 100 mg/dL — AB (ref 70–99)
Glucose-Capillary: 105 mg/dL — ABNORMAL HIGH (ref 70–99)

## 2013-03-10 MED ORDER — FERROUS SULFATE 325 (65 FE) MG PO TABS: 325.0000 mg | ORAL_TABLET | Freq: Two times a day (BID) | ORAL | Status: DC

## 2013-03-10 MED ORDER — IBUPROFEN 800 MG PO TABS: 800.0000 mg | ORAL_TABLET | Freq: Three times a day (TID) | ORAL | Status: DC | PRN

## 2013-03-10 MED ORDER — OXYCODONE-ACETAMINOPHEN 5-325 MG PO TABS: 1.0000 | ORAL_TABLET | ORAL | Status: DC | PRN

## 2013-03-10 MED ORDER — DOCUSATE SODIUM 100 MG PO CAPS: 100.0000 mg | ORAL_CAPSULE | Freq: Two times a day (BID) | ORAL | Status: DC

## 2013-03-10 NOTE — Discharge Summary (Signed)
Physician Discharge Summary  Patient ID: Susan Wilkerson MRN: [id number] DOB/AGE: [birth year] 43 y.o.  Admit date:         03/09/2013 Discharge date: 03/10/2013  Admission Diagnoses:  Uterine Fibroids; Abdomoinal pain, menorrhagia, history of anemia, diabetes  Discharge Diagnoses:   Uterine Fibroids; Abdomoinal pain  Menorrhagia, anemia, diabetes  Procedures this Admission:  03/09/2013  Procedure(s) (LRB): laporoscopic vaginal hysterectomy (N/A) Bilateral salpingectomy Cystoscopy  Discharged Condition: good   Admission Hx and PE: The patient has been followed at the [facility]. She has a history of  Uterine Fibroids; Abdomoinal pain. Please see her documented history and physical exam. The patient also has a history of diabetes.  Hospital course:  On the day of admission, the patient underwent the following Procedure(s): laporoscopic vaginal hysterectomy, bilateral salpingectomy, and cystoscopy. Operative findings included 525 g multi-fibroid uterus. Her postoperative course was uneventful. She quickly tolerated a regular diet. Her postoperative pain was controlled with oral medication. She remained afebrile. Today she was felt to be ready for discharge. Her blood sugars were monitored and were felt to be in an acceptable range.  Labs:  Hemoglobin  Date Value Ref Range Status  03/10/2013 10.2* 12.0 - 15.0 g/dL Final     HCT  Date Value Ref Range Status  03/10/2013 31.0* 36.0 - 46.0 % Final   Results for orders placed during the hospital encounter of 03/09/13 (from the past 24 hour(s))  GLUCOSE, CAPILLARY     Status: Abnormal   Collection Time    03/09/13  4:18 PM      Result Value Ref Range   Glucose-Capillary 179 (*) 70 - 99 mg/dL  CBC     Status: Abnormal   Collection Time    03/10/13  5:20 AM      Result Value Ref Range   WBC 12.6 (*) 4.0 - 10.5 K/uL   RBC 3.74 (*) 3.87 - 5.11 MIL/uL   Hemoglobin 10.2 (*)  12.0 - 15.0 g/dL   HCT 31.0 (*) 36.0 - 46.0 %   MCV 82.9  78.0 - 100.0 fL   MCH 27.3  26.0 - 34.0 pg   MCHC 32.9  30.0 - 36.0 g/dL   RDW 13.3  11.5 - 15.5 %   Platelets 243  150 - 400 K/uL  GLUCOSE, CAPILLARY     Status: Abnormal   Collection Time    03/10/13  5:27 AM      Result Value Ref Range   Glucose-Capillary 100 (*) 70 - 99 mg/dL  GLUCOSE, CAPILLARY     Status: Abnormal   Collection Time    03/10/13  7:23 AM      Result Value Ref Range   Glucose-Capillary 105 (*) 70 - 99 mg/dL   Comment 1 Documented in Chart     Comment 2 Notify RN      Consults: None  Final pathology report: Pending at the time of discharge  Disposition:  The patient will be discharged to home. She has been given a copy of the discharge instructions as prepared by the [facility] for patients who have undergone the Procedure(s): laporoscopic vaginal hysterectomy.    Future Appointments Provider Department Dept Phone   04/14/2013 11:00 AM [facility] [phone]   Please wear two piece clothing and wear no powder or deodorant. Please arrive 15 minutes early prior to your appointment time.       Medication List  STOP taking these medications       norethindrone-ethinyl estradiol 0.4-35 MG-MCG tablet  Commonly known as:  OVCON-35,BALZIVA,BRIELLYN      TAKE these medications       Biotin 5000 MCG Tabs  Take by mouth every other day.     cholecalciferol 1000 UNITS tablet  Commonly known as:  VITAMIN D  Take 1,000 Units by mouth daily as needed.     docusate sodium 100 MG capsule  Commonly known as:  COLACE  Take 1 capsule (100 mg total) by mouth 2 (two) times daily.     enalapril 5 MG tablet  Commonly known as:  VASOTEC  Take 5 mg by mouth daily.     ferrous sulfate 325 (65 FE) MG tablet  Commonly known as:  FERROUSUL  Take 1 tablet (325 mg total) by mouth 2 (two) times daily with a meal.     ibuprofen 800 MG tablet   Commonly known as:  ADVIL,MOTRIN  Take 1 tablet (800 mg total) by mouth every 8 (eight) hours as needed.     multivitamin with minerals tablet  Take 1 tablet by mouth daily.     oxyCODONE-acetaminophen 5-325 MG per tablet  Commonly known as:  ROXICET  Take 1 tablet by mouth every 4 (four) hours as needed for severe pain.           Follow-up Information   Follow up with Eli Hose, MD On 04/20/2013. (Appointment time is 8:30 a.m.)    Specialty:  Obstetrics and Gynecology   Contact information:   [address] [place] [address] [phone]       Follow up with Eli Hose, MD In 6 weeks.   Specialty:  Obstetrics and Gynecology   Contact information:   [address] [place] [address] [phone]       Signed: Eli Hose 03/10/2013, 9:39 AM

## 2013-03-10 NOTE — Discharge Instructions (Signed)
Call [facility] @ [phone] if:  You have a temperature greater than or equal to 100.4 degrees Farenheit orally You have pain that is not made better by the pain medication given and taken as directed You have excessive bleeding or problems urinating  Take Colace (Docusate Sodium/Stool Softener) 100 mg 2-3 times daily while taking narcotic pain medicine to avoid constipation or until bowel movements are regular. Take Ibuprofen 600 mg with food every 6 hours for 5 days then as needed for pain  You may drive after 2 weeks You may walk up steps You may shower tomorrow You may resume a regular diet Keep incisions clean and dry Do not lift over 15 pounds for 6 weeks Avoid anything in vagina for 6 weeks (or until after your post-operative visit)  Keep post operative appointment with Dr. Raphael Gibney on April 20, 2013 at 8:30 a.m.  Total Laparoscopic Hysterectomy, Care After Refer to this sheet in the next few weeks. These instructions provide you with information on caring for yourself after your procedure. Your health care provider may also give you more specific instructions. Your treatment has been planned according to current medical practices, but problems sometimes occur. Call your health care provider if you have any problems or questions after your procedure. WHAT TO EXPECT AFTER THE PROCEDURE  Pain and bruising at the incision sites. You will be given pain medicine to control it.  Menopausal symptoms such as hot flashes, night sweats, and insomnia if your ovaries were removed.  Sore throat from the breathing tube that was inserted during surgery. HOME CARE INSTRUCTIONS  Only take over-the-counter or prescription medicines for pain, discomfort, or fever as directed by your health care provider.   Do not take aspirin. It can cause bleeding.   Do not drive when taking pain medicine.   Follow your health care provider's advice regarding diet, exercise, lifting,  driving, and general activities.   Resume your usual diet as directed and allowed.   Get plenty of rest and sleep.   Do not douche, use tampons, or have sexual intercourse for at least 6 weeks, or until your health care provider gives you permission.   Change your bandages (dressings) as directed by your health care provider.   Monitor your temperature and notify your health care provider of a fever.   Take showers instead of baths for 2 3 weeks.   Do not drink alcohol until your health care provider gives you permission.   If you develop constipation, you may take a mild laxative with your health care provider's permission. Bran foods may help with constipation problems. Drinking enough fluids to keep your urine clear or pale yellow may help as well.   Try to have someone home with you for 1 2 weeks to help around the house.   Keep all of your follow-up appointments as directed by your health care provider.  SEEK MEDICAL CARE IF:  You have swelling, redness, or increasing pain around your incision sites.   You have pus coming from your incision.   You notice a bad smell coming from your incision.   Your incision breaks open.   You feel dizzy or lightheaded.   You have pain or bleeding when you urinate.   You have persistent diarrhea.   You have persistent nausea and vomiting.   You have abnormal vaginal discharge.   You have a rash.   You have any type of abnormal reaction or develop an allergy to your medicine.  You have poor pain control with your prescribed medicine.  SEEK IMMEDIATE MEDICAL CARE IF:  You have chest pain or shortness of breath.  You have severe abdominal pain that is not relieved with pain medicine.  You have pain or swelling in your legs. Document Released: 10/19/2012 Document Reviewed: 07/19/2012 [facility] Patient Information 2014 [place], [place].

## 2013-03-10 NOTE — Progress Notes (Signed)
UR chart review completed.  

## 2013-03-10 NOTE — Progress Notes (Signed)
Pt discharged home with husband... Condition stable... No equipment... Ambulated to car with Janalyn Shy, RN.

## 2013-04-14 ENCOUNTER — Ambulatory Visit
Admission: RE | Admit: 2013-04-14 | Discharge: 2013-04-14 | Disposition: A | Discharge status: Home / Self Care | Payer: BC Managed Care – PPO | Source: Ambulatory Visit

## 2013-04-14 DIAGNOSIS — Z1231 Encounter for screening mammogram for malignant neoplasm of breast: Secondary | ICD-10-CM

## 2013-04-14 IMAGING — MG MM SCREENING BREAST TOMO BILATERAL
9 of 12 series · 9 of 28 positions shown · non-contrast
Comparison: Previous exam(s).

CLINICAL DATA: Screening.

EXAM:
DIGITAL SCREENING BILATERAL MAMMOGRAM WITH 3D TOMO WITH CAD

[L MLO]
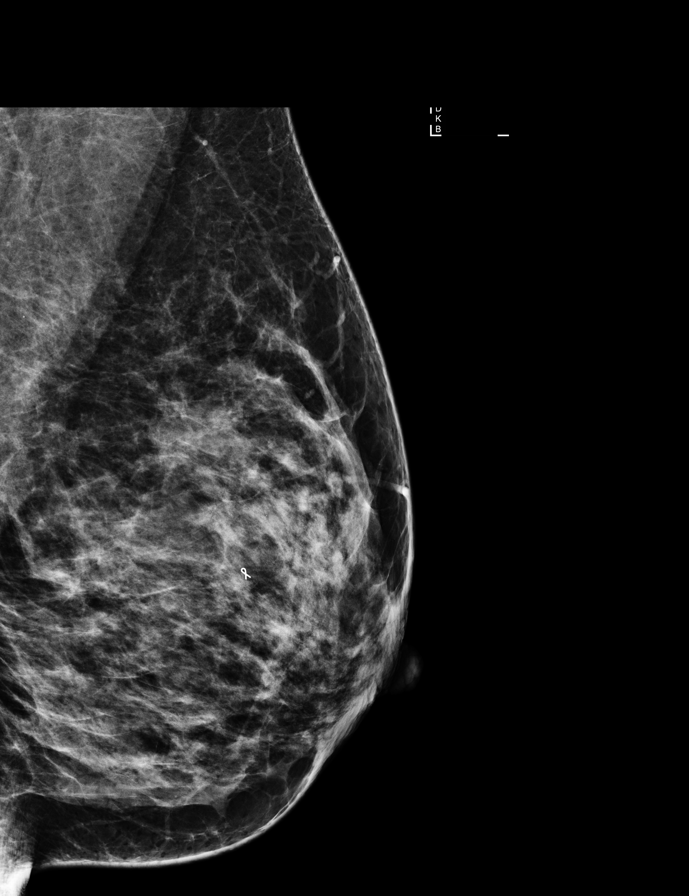

[L MLO synth-2D]
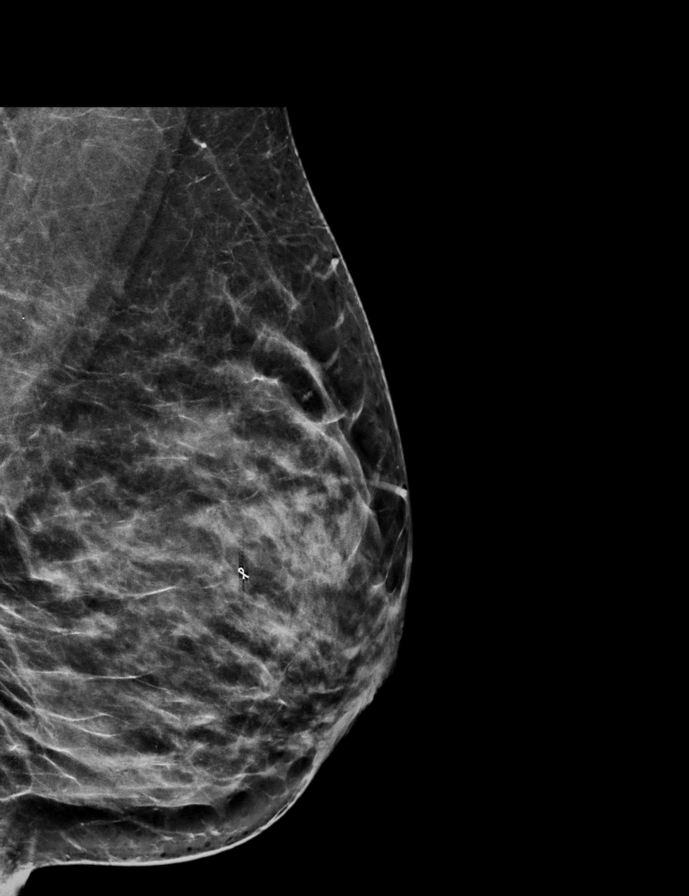

[L CC synth-2D]
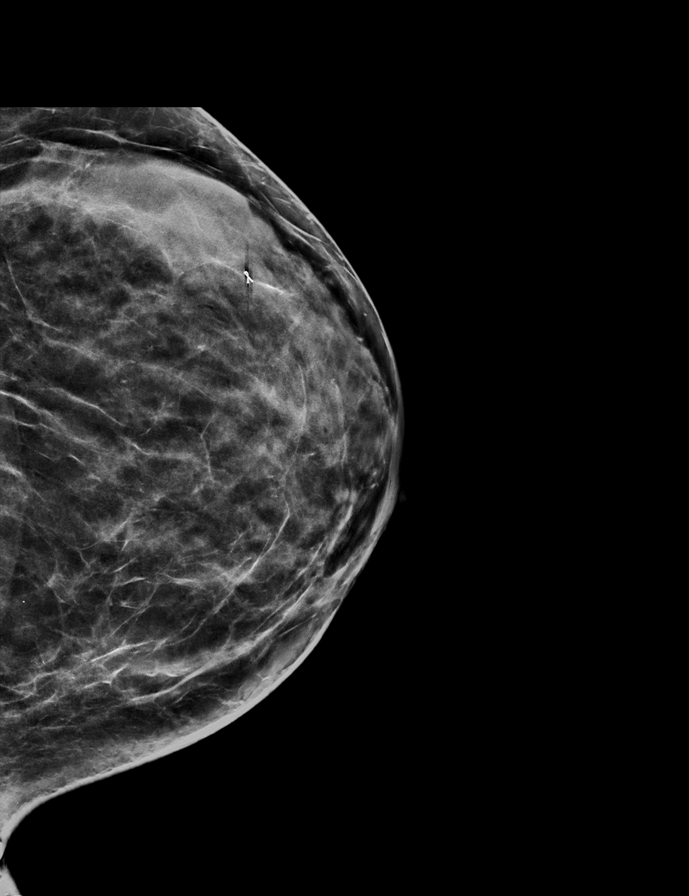

[R MLO]
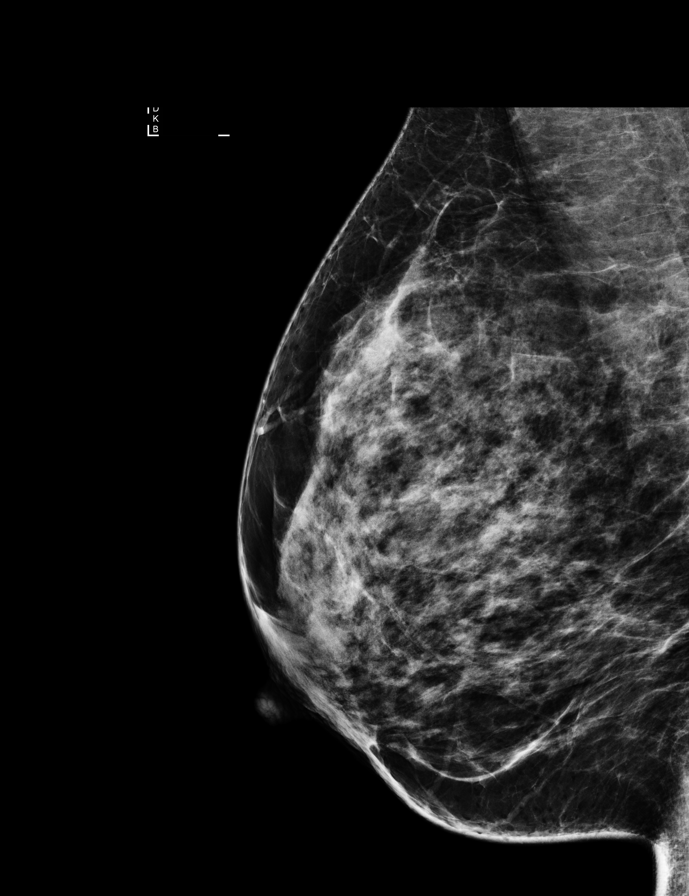

[R CC]
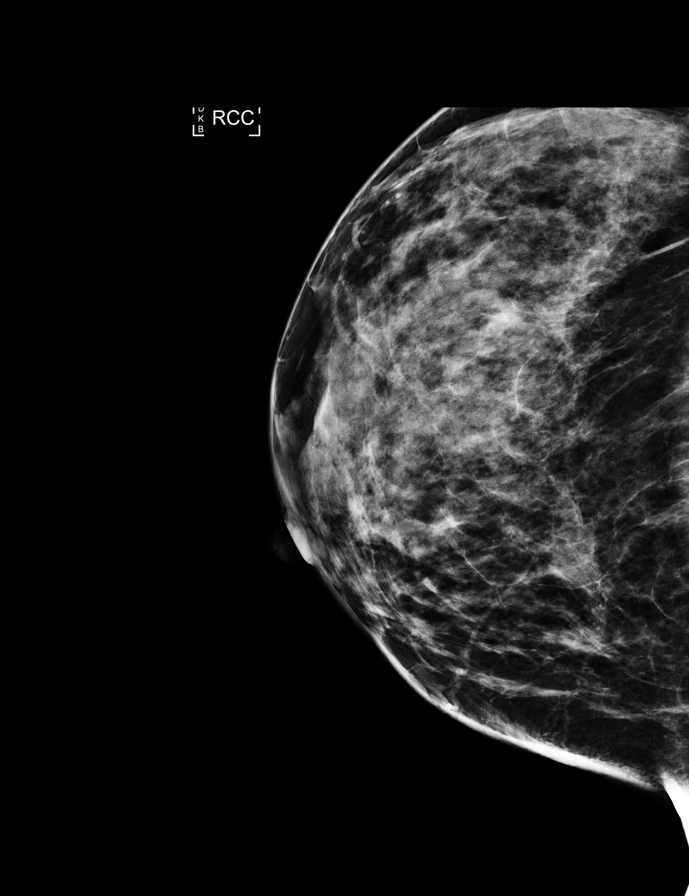

[R CC synth-2D]
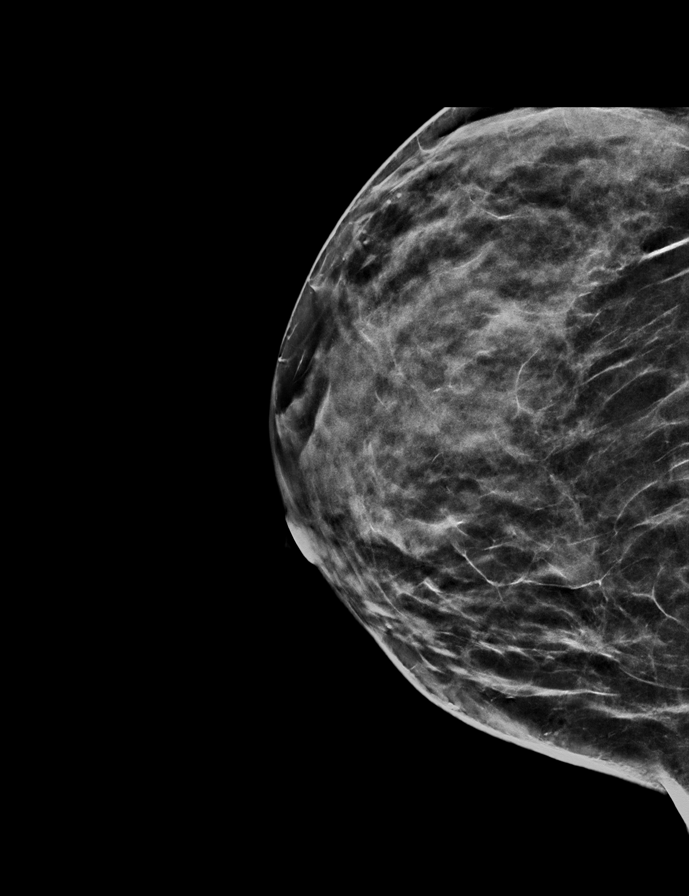

[R MLO synth-2D]
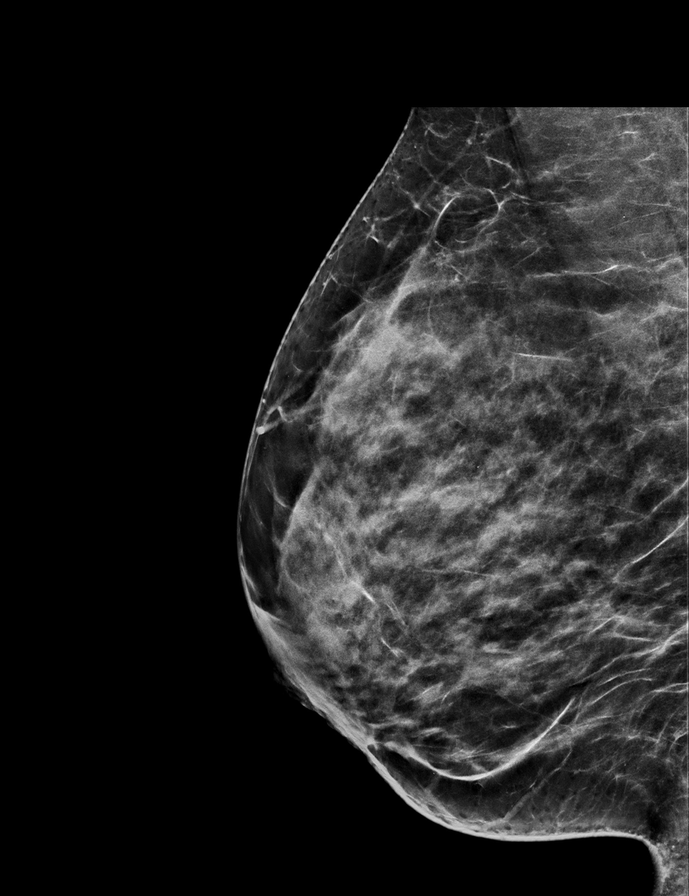

[L CC]
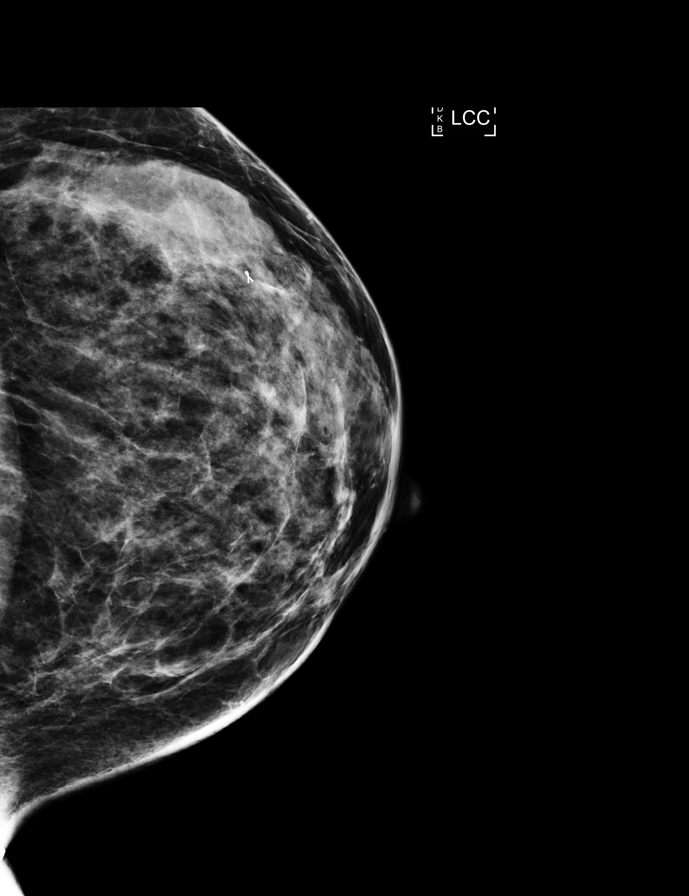

[R CC tomo · tomo slice 29/57.0]
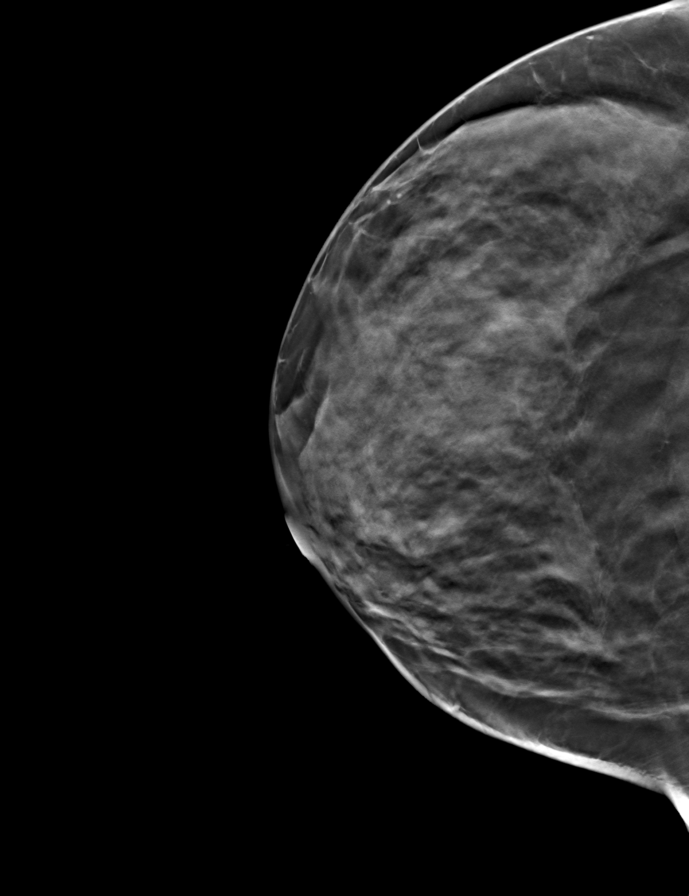

[9 of 28 positions shown; findings below may reference images not displayed]

ACR Breast Density Category c: The breast tissue is heterogeneously
dense, which may obscure small masses.
FINDINGS: There are no findings suspicious for malignancy. Images were
processed with CAD.
IMPRESSION: No mammographic evidence of malignancy. A result letter of this
screening mammogram will be mailed directly to the patient.

RECOMMENDATION:
Screening mammogram in one year. (Code:[id number])

BI-RADS CATEGORY  1: Negative.

## 2013-10-23 ENCOUNTER — Other Ambulatory Visit: Payer: Self-pay | Admitting: Obstetrics and Gynecology

## 2013-11-13 ENCOUNTER — Encounter (HOSPITAL_COMMUNITY): Payer: Self-pay | Admitting: Obstetrics and Gynecology

## 2014-03-23 ENCOUNTER — Other Ambulatory Visit: Payer: Self-pay

## 2014-03-23 DIAGNOSIS — Z1231 Encounter for screening mammogram for malignant neoplasm of breast: Secondary | ICD-10-CM

## 2014-04-16 ENCOUNTER — Ambulatory Visit
Admission: RE | Admit: 2014-04-16 | Discharge: 2014-04-16 | Disposition: A | Discharge status: Home / Self Care | Payer: BLUE CROSS/BLUE SHIELD | Source: Ambulatory Visit

## 2014-04-16 DIAGNOSIS — Z1231 Encounter for screening mammogram for malignant neoplasm of breast: Secondary | ICD-10-CM

## 2015-03-11 ENCOUNTER — Other Ambulatory Visit: Payer: Self-pay

## 2015-03-11 DIAGNOSIS — Z1231 Encounter for screening mammogram for malignant neoplasm of breast: Secondary | ICD-10-CM

## 2015-04-17 ENCOUNTER — Ambulatory Visit: Payer: BLUE CROSS/BLUE SHIELD

## 2015-04-19 ENCOUNTER — Ambulatory Visit
Admission: RE | Admit: 2015-04-19 | Discharge: 2015-04-19 | Disposition: A | Discharge status: Home / Self Care | Payer: BLUE CROSS/BLUE SHIELD | Source: Ambulatory Visit

## 2015-04-19 DIAGNOSIS — Z1231 Encounter for screening mammogram for malignant neoplasm of breast: Secondary | ICD-10-CM

## 2015-04-19 IMAGING — MG MM SCREENING BREAST TOMO BILATERAL
8 of 12 series · 8 of 28 positions shown · non-contrast
Comparison: Previous exam(s).

CLINICAL DATA: Screening.

EXAM:
2D DIGITAL SCREENING BILATERAL MAMMOGRAM WITH CAD AND ADJUNCT TOMO

[L MLO]
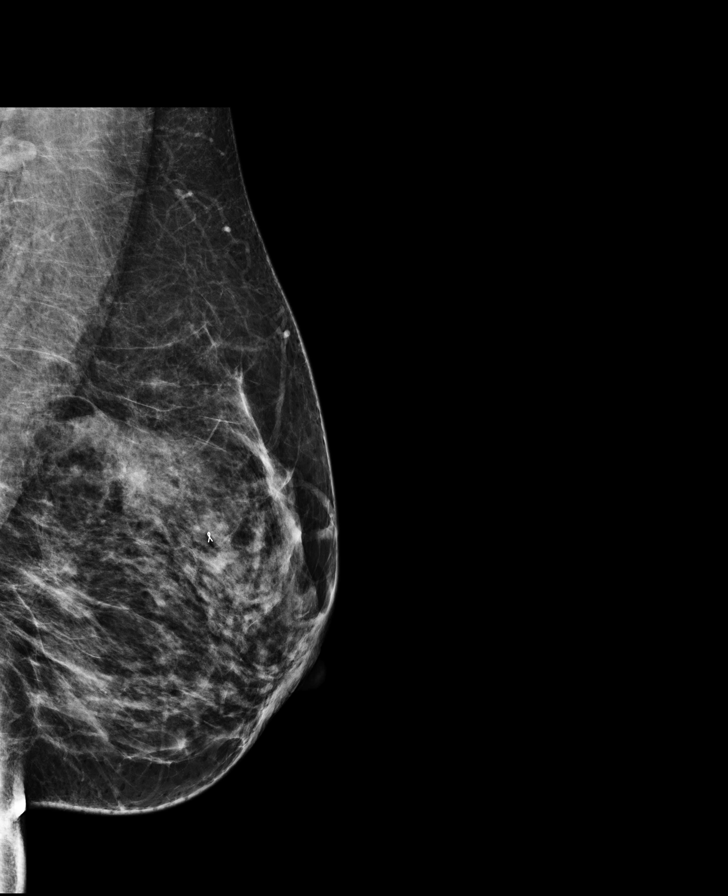

[L MLO synth-2D]
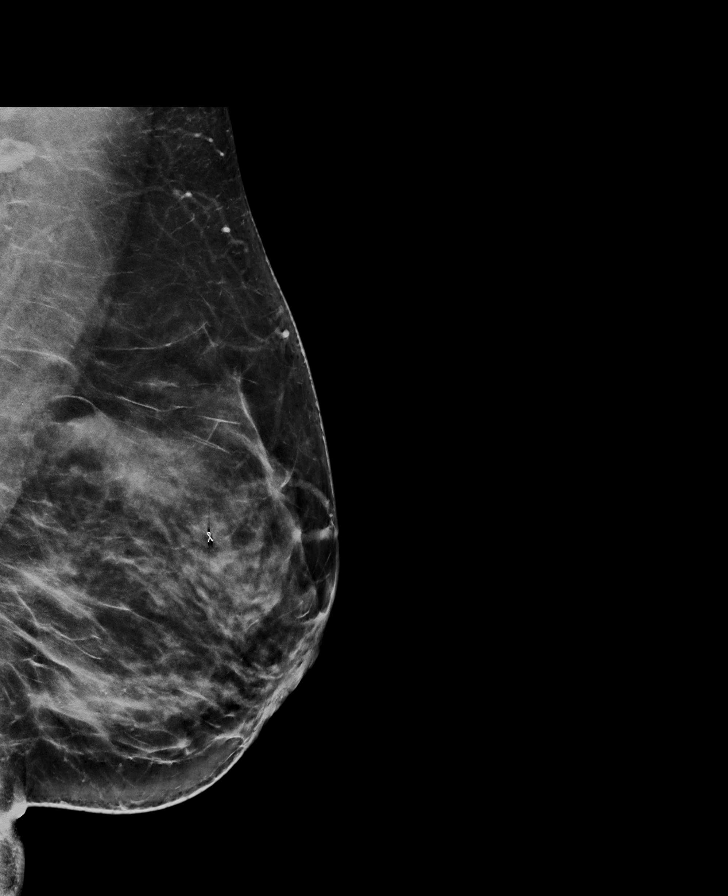

[L CC]
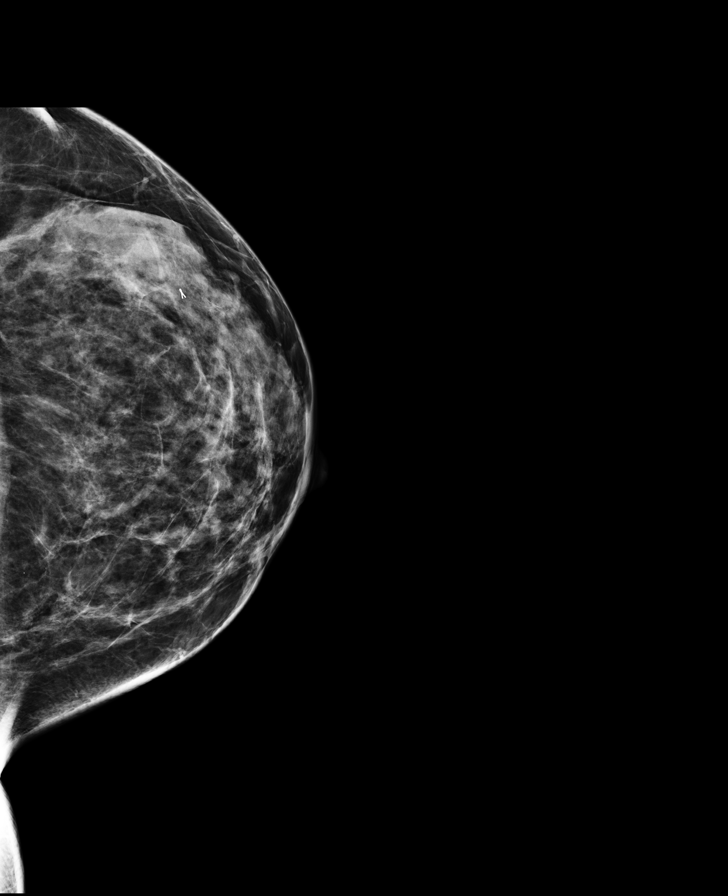

[R CC]
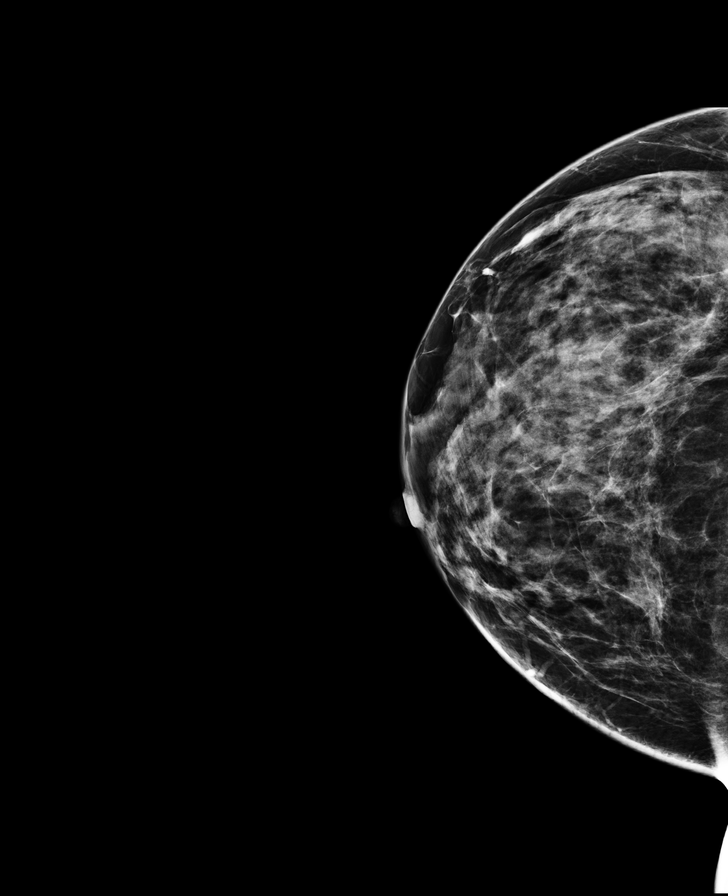

[L CC synth-2D]
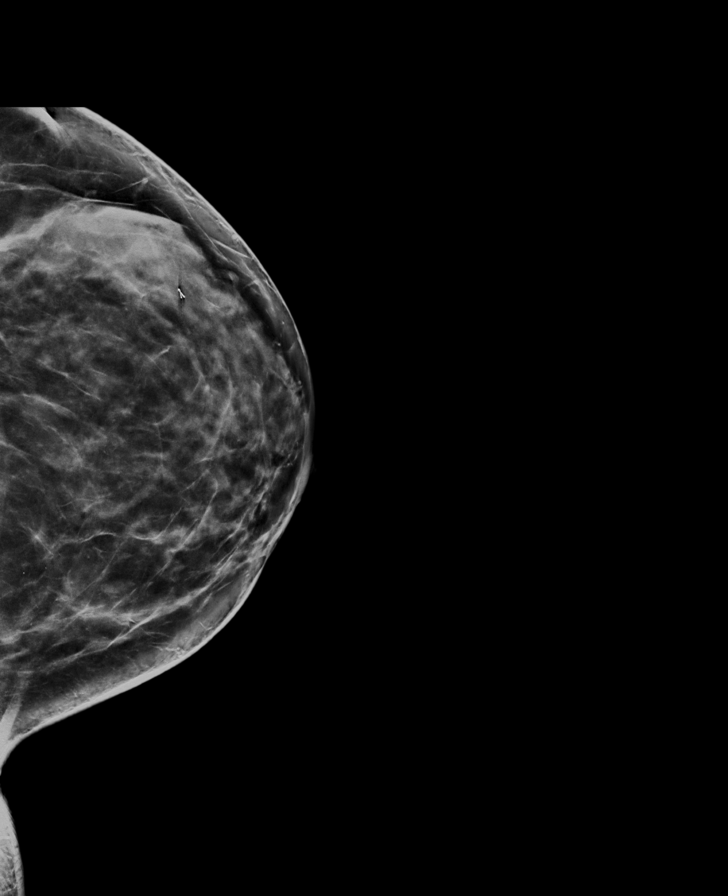

[R MLO]
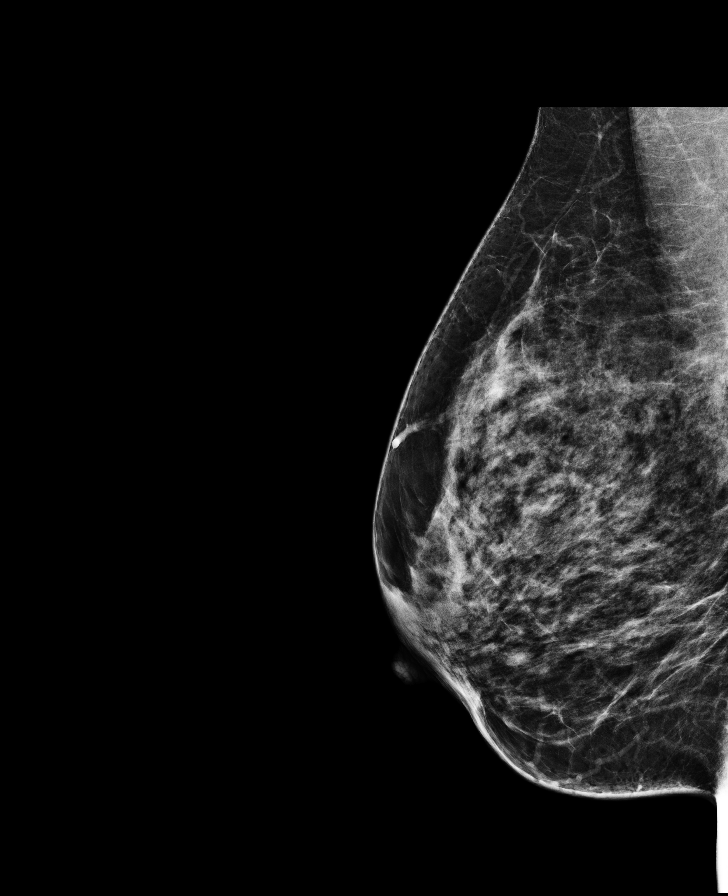

[R MLO synth-2D]
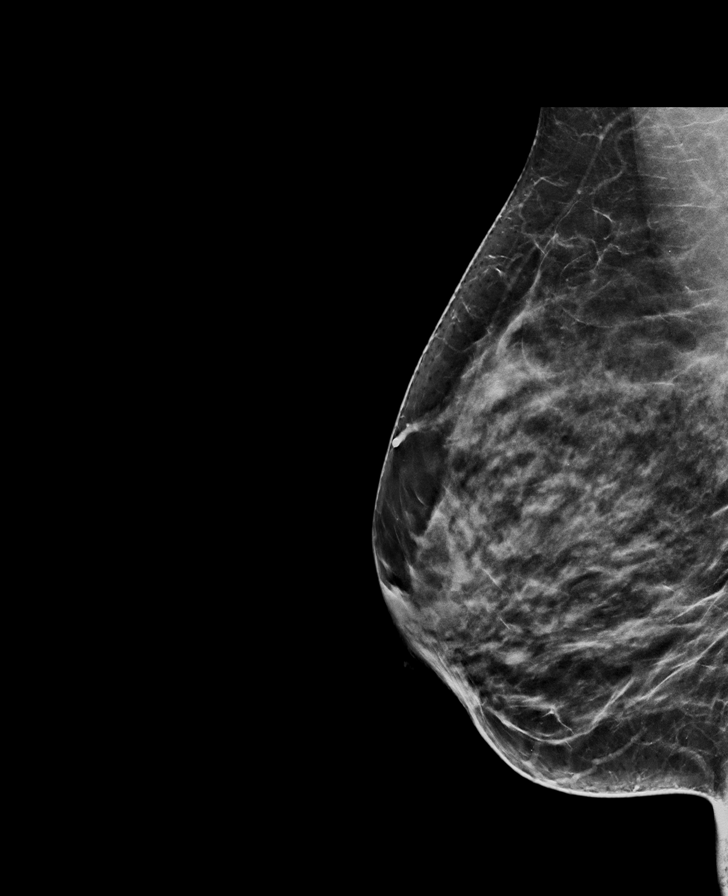

[R CC synth-2D]
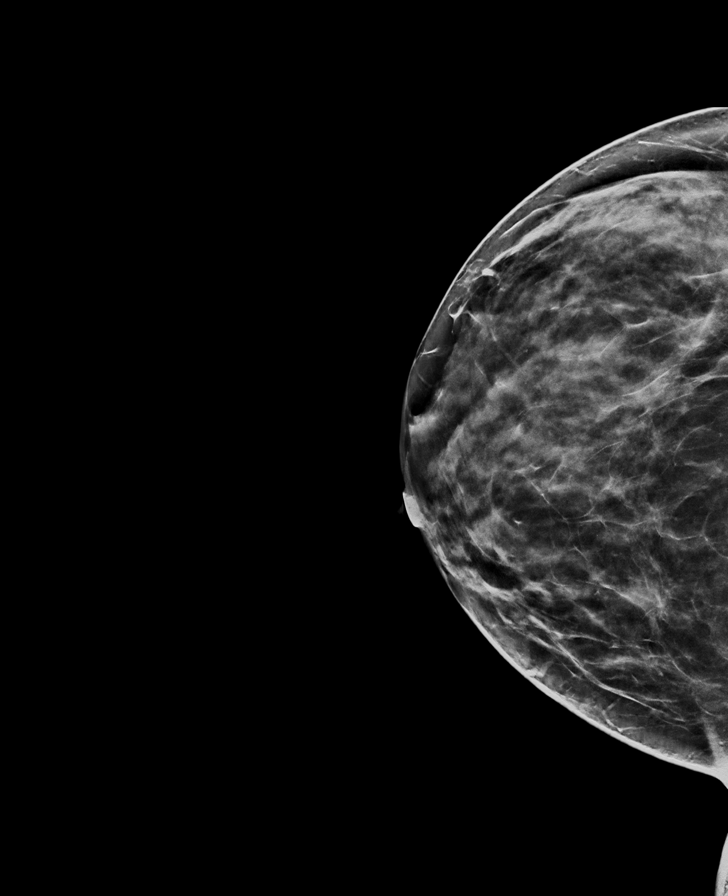

[8 of 28 positions shown; findings below may reference images not displayed]

ACR Breast Density Category c: The breast tissue is heterogeneously
dense, which may obscure small masses.
FINDINGS: There are no findings suspicious for malignancy. Images were
processed with CAD.
IMPRESSION: No mammographic evidence of malignancy. A result letter of this
screening mammogram will be mailed directly to the patient.

RECOMMENDATION:
Screening mammogram in one year. (Code:[id number])

BI-RADS CATEGORY  1: Negative.

## 2016-03-11 ENCOUNTER — Other Ambulatory Visit: Payer: Self-pay | Admitting: Family Medicine

## 2016-03-11 DIAGNOSIS — Z1231 Encounter for screening mammogram for malignant neoplasm of breast: Secondary | ICD-10-CM

## 2016-04-20 ENCOUNTER — Ambulatory Visit
Admission: RE | Admit: 2016-04-20 | Discharge: 2016-04-20 | Disposition: A | Discharge status: Home / Self Care | Payer: BLUE CROSS/BLUE SHIELD | Source: Ambulatory Visit | Attending: Family Medicine | Admitting: Family Medicine

## 2016-04-20 DIAGNOSIS — Z1231 Encounter for screening mammogram for malignant neoplasm of breast: Secondary | ICD-10-CM

## 2017-03-15 ENCOUNTER — Other Ambulatory Visit: Payer: Self-pay | Admitting: Family Medicine

## 2017-03-15 DIAGNOSIS — Z1231 Encounter for screening mammogram for malignant neoplasm of breast: Secondary | ICD-10-CM

## 2017-04-21 ENCOUNTER — Ambulatory Visit
Admission: RE | Admit: 2017-04-21 | Discharge: 2017-04-21 | Disposition: A | Discharge status: Home / Self Care | Payer: BLUE CROSS/BLUE SHIELD | Source: Ambulatory Visit | Attending: Family Medicine | Admitting: Family Medicine

## 2017-04-21 DIAGNOSIS — Z1231 Encounter for screening mammogram for malignant neoplasm of breast: Secondary | ICD-10-CM

## 2017-04-21 IMAGING — MG DIGITAL SCREENING BILATERAL MAMMOGRAM WITH TOMO AND CAD
8 series · 8 of 24 positions shown · non-contrast
Comparison: Previous exam(s).

CLINICAL DATA: Screening.

EXAM:
DIGITAL SCREENING BILATERAL MAMMOGRAM WITH TOMO AND CAD

[L MLO synth-2D]
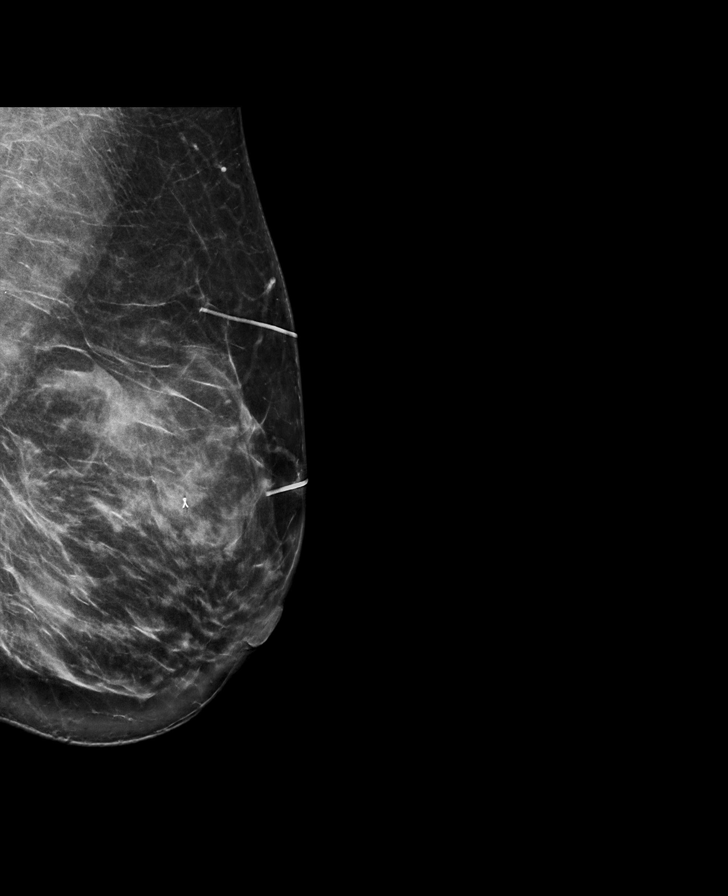

[R CC synth-2D]
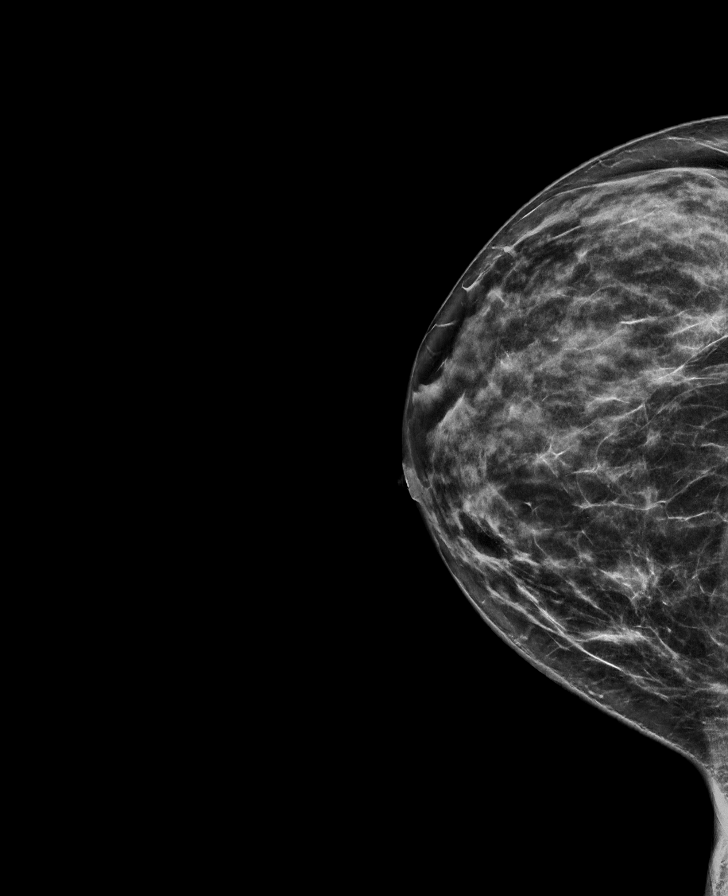

[L CC synth-2D]
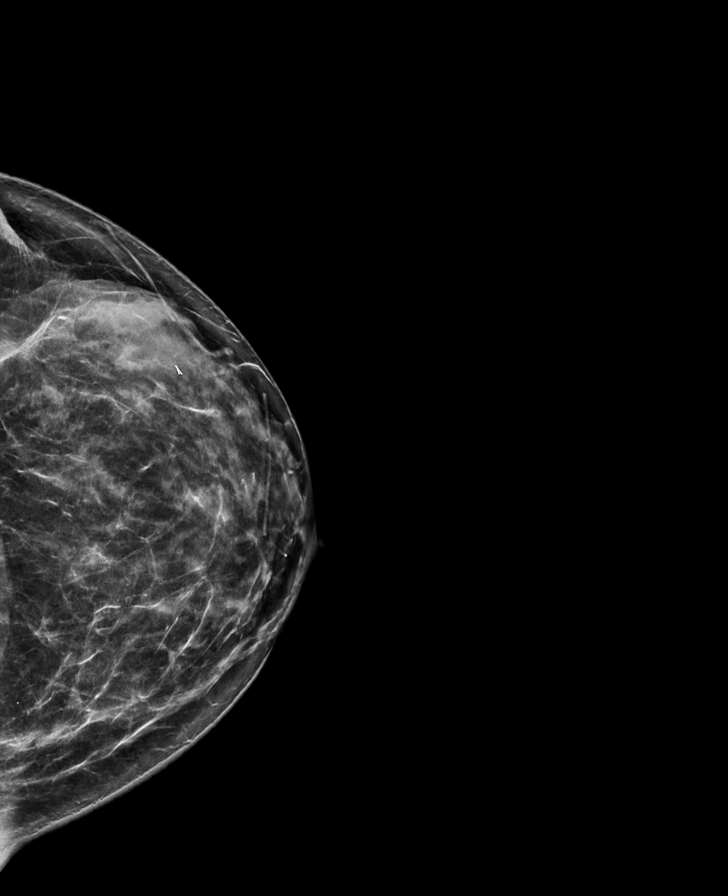

[R MLO synth-2D]
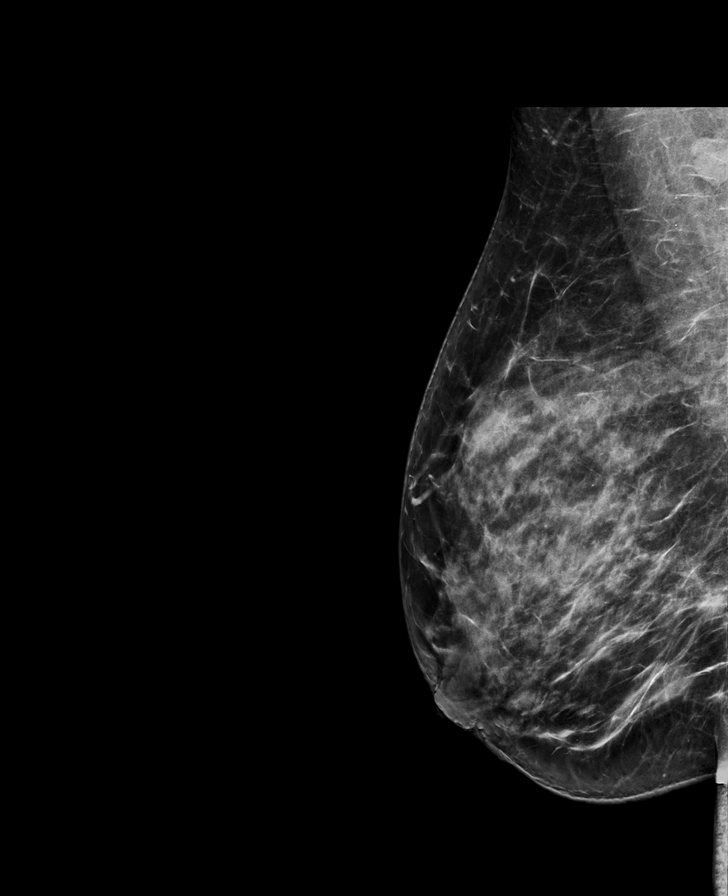

[L CC tomo · tomo slice 35/70.0]
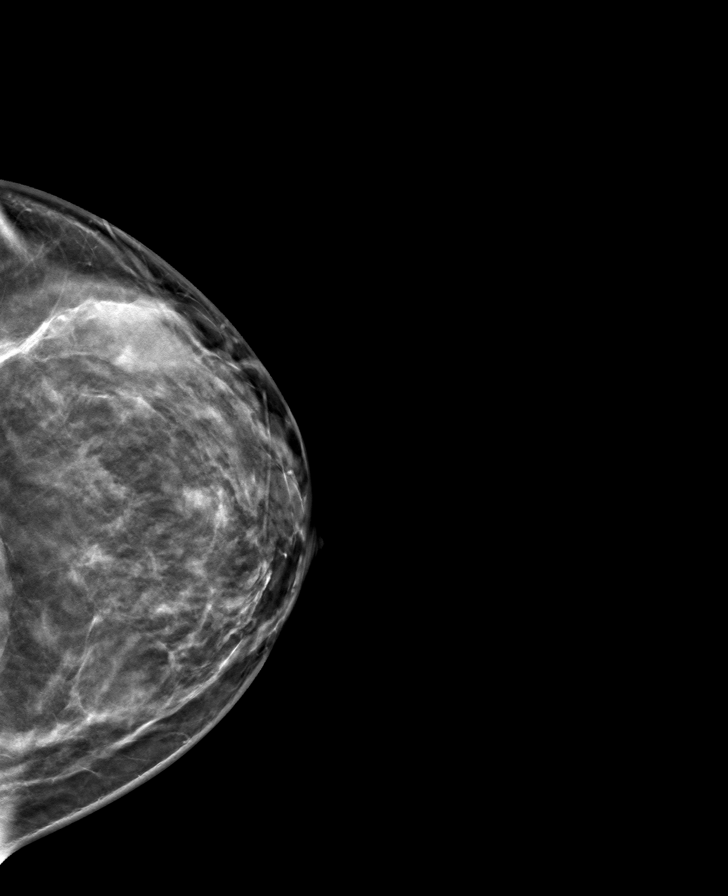

[R CC tomo · tomo slice 34/67.0]
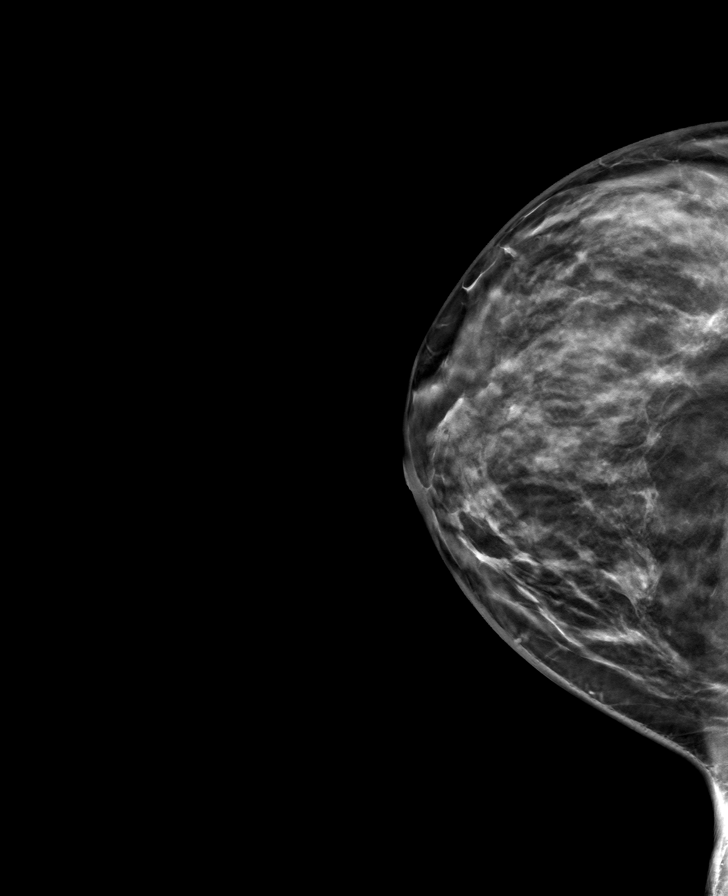

[R MLO tomo · tomo slice 40/79.0]
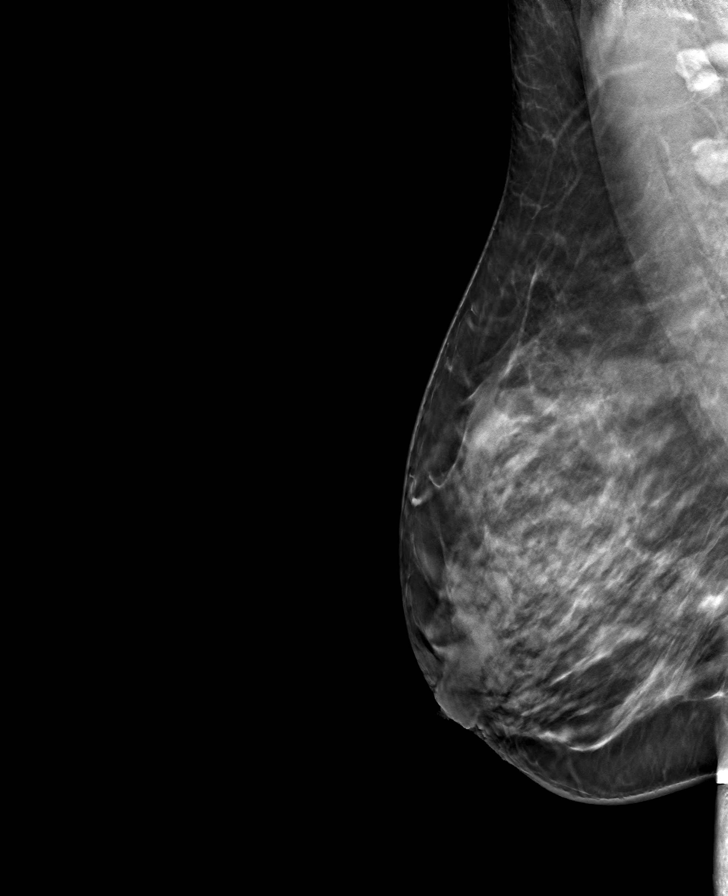

[L MLO tomo · tomo slice 40/79.0]
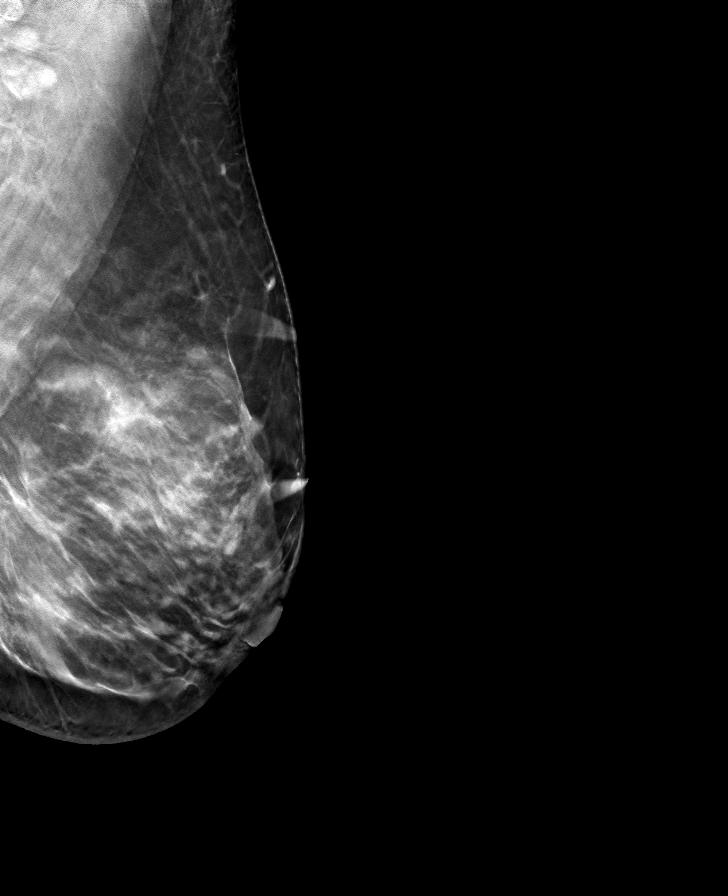

[8 of 24 positions shown; findings below may reference images not displayed]

ACR Breast Density Category c: The breast tissue is heterogeneously
dense, which may obscure small masses.
FINDINGS: There are no findings suspicious for malignancy. Images were
processed with CAD.
IMPRESSION: No mammographic evidence of malignancy. A result letter of this
screening mammogram will be mailed directly to the patient.

RECOMMENDATION:
Screening mammogram in one year. (Code:[id number])

BI-RADS CATEGORY  1: Negative.

## 2017-12-07 ENCOUNTER — Encounter (HOSPITAL_COMMUNITY): Payer: Self-pay | Admitting: Emergency Medicine

## 2017-12-07 ENCOUNTER — Ambulatory Visit (HOSPITAL_COMMUNITY)
Admission: EM | Admit: 2017-12-07 | Discharge: 2017-12-07 | Disposition: A | Discharge status: Home / Self Care | Payer: BLUE CROSS/BLUE SHIELD | Attending: Family Medicine | Admitting: Family Medicine

## 2017-12-07 DIAGNOSIS — R42 Dizziness and giddiness: Secondary | ICD-10-CM

## 2017-12-07 DIAGNOSIS — J069 Acute upper respiratory infection, unspecified: Secondary | ICD-10-CM

## 2017-12-07 LAB — GLUCOSE, CAPILLARY: GLUCOSE-CAPILLARY: 82 mg/dL (ref 70–99)

## 2017-12-07 MED ORDER — FLUTICASONE PROPIONATE 50 MCG/ACT NA SUSP: 1.0000 | Freq: Every day | NASAL | 0 refills | Status: DC

## 2017-12-07 MED ORDER — MECLIZINE HCL 12.5 MG PO TABS: 12.5000 mg | ORAL_TABLET | Freq: Three times a day (TID) | ORAL | 0 refills | Status: DC | PRN

## 2017-12-07 NOTE — ED Provider Notes (Signed)
[place]    CSN: [id number] Arrival date & time: 12/07/17  1526     History   Chief Complaint Chief Complaint  Patient presents with  . URI  . Dizziness    HPI Susan Wilkerson is a 47 y.o. female.   HPI  Patient is here for dizziness and upper respiratory infection.  She is had vertigo repeatedly.  She knows exercises to do at home and what positions will provoke her vertigo.  This is been present for 2 to 3 days.  She then developed upper respiratory symptoms.  Some runny and stuffy nose and ear pressure.  She is developed tinnitus in her right ear.  She only hears the tinnitus when someone is trying to speak to her. Since she is not feeling well she has not eaten much today.  She feels like her blood sugar is dropping.  We did check it and it was 82. No head injury or trauma.  No sinus pressure pain.  No fever or chills.  No sore throat.  No headache She states she tried to call an ENT to be seen, however, could not go without a referral.  She has plans to see her PCP for an ENT referral  Past Medical History:  Diagnosis Date  . Breakthrough bleeding on birth control pills 06/02/2006   history  . Diabetes mellitus without complication (HCC)    tx - enalapril type II  . Endometrial polyp 09/30/2009  . Glucosuria 08/30/07  . H/O amenorrhea 2001  . H/O dysmenorrhea 2005  . H/O herpes simplex type 2 infection   . H/O vaginal discharge 2010  . Headache(784.0)    otc med prn  . Hx gestational diabetes 2000   GDM with pregnancy, after delivery it did not go away  . Hx: UTI (urinary tract infection) 1997  . Irregular periods/menstrual cycles 2011  . Lactose intolerance   . Spotting 2011   history  . SVD (spontaneous vaginal delivery)    x 1  . Varicose veins     Patient Active Problem List   Diagnosis Date Noted  . Fibroids 03/09/2013  . S/P laparoscopic assisted vaginal hysterectomy (LAVH) 03/09/2013  . Fibroid 08/24/2011    Past Surgical History:    Procedure Laterality Date  . ABDOMINAL HYSTERECTOMY N/A 03/09/2013   Procedure: laporoscopic vaginal hysterectomy;  Surgeon: Ena Dawley, MD;  Location: [facility];  Service: Gynecology;  Laterality: N/A;  . BREAST BIOPSY  06/22/2006  . BREAST EXCISIONAL BIOPSY Left 2008   benign  . BREAST EXCISIONAL BIOPSY Left 1990   benign  . BREAST SURGERY     right side x 3  . CHOLECYSTECTOMY  2005  . DILATION AND CURETTAGE OF UTERUS     polyp  . LUMP REMOVED FROM RIGHT BREAST  1990  . WISDOM TOOTH EXTRACTION      OB History    Gravida  1   Para  1   Term  1   Preterm      AB      Living  1     SAB      TAB      Ectopic      Multiple      Live Births  1            Home Medications    Prior to Admission medications   Medication Sig Start Date End Date Taking? Authorizing Provider  Biotin 5000 MCG TABS Take by mouth every other  day.    [provider]  cholecalciferol (VITAMIN D) 1000 UNITS tablet Take 1,000 Units by mouth daily as needed.     [provider]  docusate sodium (COLACE) 100 MG capsule Take 1 capsule (100 mg total) by mouth 2 (two) times daily. 03/10/13   Ena Dawley, MD  enalapril (VASOTEC) 5 MG tablet Take 5 mg by mouth daily.    [provider]  ferrous sulfate (FERROUSUL) 325 (65 FE) MG tablet Take 1 tablet (325 mg total) by mouth 2 (two) times daily with a meal. 03/10/13   Ena Dawley, MD  fluticasone [facility]) 50 MCG/ACT nasal spray Place 1 spray into both nostrils daily. 12/07/17   Raylene Everts, MD  ibuprofen (ADVIL,MOTRIN) 800 MG tablet Take 1 tablet (800 mg total) by mouth every 8 (eight) hours as needed. 03/10/13   Ena Dawley, MD  meclizine (ANTIVERT) 12.5 MG tablet Take 1 tablet (12.5 mg total) by mouth 3 (three) times daily as needed for dizziness. 12/07/17   Raylene Everts, MD  Multiple Vitamins-Minerals (MULTIVITAMIN WITH MINERALS) tablet Take 1 tablet by mouth daily.    [provider]    Family History Family History  Problem Relation Age of Onset  . Hypertension Mother   . Diabetes Mother   . Cancer Mother        Uterine  . Diabetes Father   . Cancer Father        Mouth , lung , & stomach  . Alcohol abuse Father   . Hypertension Sister   . Diabetes Sister        Four sisters  - all diabetic  . Learning disabilities Sister        Schizophrenia  . Drug abuse Sister        Cocaine  . Breast cancer Sister   . Cancer Paternal Aunt        Stomach & lung  . Cancer Paternal Uncle        Lung  . Diabetes Maternal Grandmother   . Breast cancer Cousin     Social History Social History   Tobacco Use  . Smoking status: Never Smoker  . Smokeless tobacco: Never Used  Substance Use Topics  . Alcohol use: No  . Drug use: No     Allergies   Milk-related compounds   Review of Systems Review of Systems  Constitutional: Negative for chills and fever.  HENT: Positive for congestion, postnasal drip and rhinorrhea. Negative for ear pain and sore throat.   Eyes: Negative for pain and visual disturbance.  Respiratory: Negative for cough and shortness of breath.   Cardiovascular: Negative for chest pain and palpitations.  Gastrointestinal: Negative for abdominal pain and vomiting.  Genitourinary: Negative for dysuria and hematuria.  Musculoskeletal: Negative for arthralgias and back pain.  Skin: Negative for color change and rash.  Neurological: Positive for dizziness and light-headedness. Negative for seizures and syncope.  All other systems reviewed and are negative.    Physical Exam Triage Vital Signs ED Triage Vitals [12/07/17 1627]  Enc Vitals Group     BP (!) 155/68     Pulse Rate 93     Resp 18     Temp 98.3 F (36.8 C)     Temp Source Oral     SpO2 100 %     Weight      Height      Head Circumference      Peak Flow  Pain Score 3     Pain Loc      Pain Edu?      Excl. in [place]?    No data found.  Updated Vital Signs BP (!)  155/68 (BP Location: Left Arm)   Pulse 93   Temp 98.3 F (36.8 C) (Oral)   Resp 18   SpO2 100%   Physical Exam  Constitutional: She appears well-developed and well-nourished. No distress.  HENT:  Head: Normocephalic and atraumatic.  Right Ear: External ear normal.  Left Ear: External ear normal.  Mouth/Throat: Oropharynx is clear and moist.  TMs are clear.  Clear rhinorrhea.  Eyes: Pupils are equal, round, and reactive to light. Conjunctivae are normal.  No nystagmus  Neck: Normal range of motion. Neck supple. No thyromegaly present.  Cardiovascular: Normal rate.  Pulmonary/Chest: Effort normal. No respiratory distress.  Abdominal: Soft. She exhibits no distension.  Musculoskeletal: Normal range of motion. She exhibits no edema.  Lymphadenopathy:    She has no cervical adenopathy.  Neurological: She is alert. She displays normal reflexes. Coordination normal.  Skin: Skin is warm and dry.  Psychiatric: She has a normal mood and affect. Her behavior is normal.     UC Treatments / Results  Labs (all labs ordered are listed, but only abnormal results are displayed) Labs Reviewed  GLUCOSE, CAPILLARY    EKG None  Radiology No results found.  Procedures Procedures (including critical care time)  Medications Ordered in UC Medications - No data to display  Initial Impression / Assessment and Plan / UC Course  I have reviewed the triage vital signs and the nursing notes.  Pertinent labs & imaging results that were available during my care of the patient were reviewed by me and considered in my medical decision making (see chart for details).     We discussed vertigo.  [place]y exercises.  Reviewed symptomatic care of upper respiratory infection. Final Clinical Impressions(s) / UC Diagnoses   Final diagnoses:  Acute upper respiratory infection  Dizziness  Vertigo     Discharge Instructions     Drink plenty of fluids Take the meclizine as needed for vertigo Use a  steroid nasal spray like fluticasone to open up eustachian tubes and help with ear pressure Follow up with ENT  Blood sugar 82   ED Prescriptions    Medication Sig Dispense Auth. Provider   meclizine (ANTIVERT) 12.5 MG tablet Take 1 tablet (12.5 mg total) by mouth 3 (three) times daily as needed for dizziness. 30 tablet Raylene Everts, MD   fluticasone [facility]) 50 MCG/ACT nasal spray Place 1 spray into both nostrils daily. 16 g Raylene Everts, MD     Controlled Substance Prescriptions  Controlled Substance Registry consulted? Not Applicable   Raylene Everts, MD 12/07/17 2113

## 2017-12-07 NOTE — ED Triage Notes (Signed)
Pt here for URI and dizziness x 2 days

## 2017-12-07 NOTE — ED Notes (Signed)
CBG 82 mg/dL reported to Dr. Meda Coffee

## 2017-12-07 NOTE — Discharge Instructions (Signed)
Drink plenty of fluids Take the meclizine as needed for vertigo Use a steroid nasal spray like fluticasone to open up eustachian tubes and help with ear pressure Follow up with ENT  Blood sugar 82

## 2018-02-17 ENCOUNTER — Other Ambulatory Visit: Payer: Self-pay | Admitting: Family Medicine

## 2018-02-17 DIAGNOSIS — Z1231 Encounter for screening mammogram for malignant neoplasm of breast: Secondary | ICD-10-CM

## 2018-02-22 DIAGNOSIS — R42 Dizziness and giddiness: Secondary | ICD-10-CM | POA: Diagnosis not present

## 2018-02-22 DIAGNOSIS — E7800 Pure hypercholesterolemia, unspecified: Secondary | ICD-10-CM | POA: Diagnosis not present

## 2018-02-22 DIAGNOSIS — E119 Type 2 diabetes mellitus without complications: Secondary | ICD-10-CM | POA: Diagnosis not present

## 2018-02-22 DIAGNOSIS — N644 Mastodynia: Secondary | ICD-10-CM | POA: Diagnosis not present

## 2018-02-22 DIAGNOSIS — Z7984 Long term (current) use of oral hypoglycemic drugs: Secondary | ICD-10-CM | POA: Diagnosis not present

## 2018-02-24 ENCOUNTER — Other Ambulatory Visit: Payer: Self-pay | Admitting: Family Medicine

## 2018-02-24 DIAGNOSIS — N644 Mastodynia: Secondary | ICD-10-CM

## 2018-03-03 ENCOUNTER — Ambulatory Visit
Admission: RE | Admit: 2018-03-03 | Discharge: 2018-03-03 | Disposition: A | Discharge status: Home / Self Care | Payer: 59 | Source: Ambulatory Visit | Attending: Family Medicine | Admitting: Family Medicine

## 2018-03-03 ENCOUNTER — Ambulatory Visit: Payer: Self-pay

## 2018-03-03 DIAGNOSIS — N644 Mastodynia: Secondary | ICD-10-CM

## 2018-03-03 DIAGNOSIS — R922 Inconclusive mammogram: Secondary | ICD-10-CM | POA: Diagnosis not present

## 2018-03-03 IMAGING — MG DIGITAL DIAGNOSTIC UNILATERAL LEFT MAMMOGRAM WITH TOMO AND CAD
4 series · 4 of 12 positions shown · non-contrast
Comparison: Previous exam(s).

CLINICAL DATA: 48-year-old female with intermittent shooting pain
involving the medial and lateral left breast for approximately 2
months.

EXAM:
DIGITAL DIAGNOSTIC UNILATERAL LEFT MAMMOGRAM WITH CAD AND TOMO

[L MLO synth-2D]
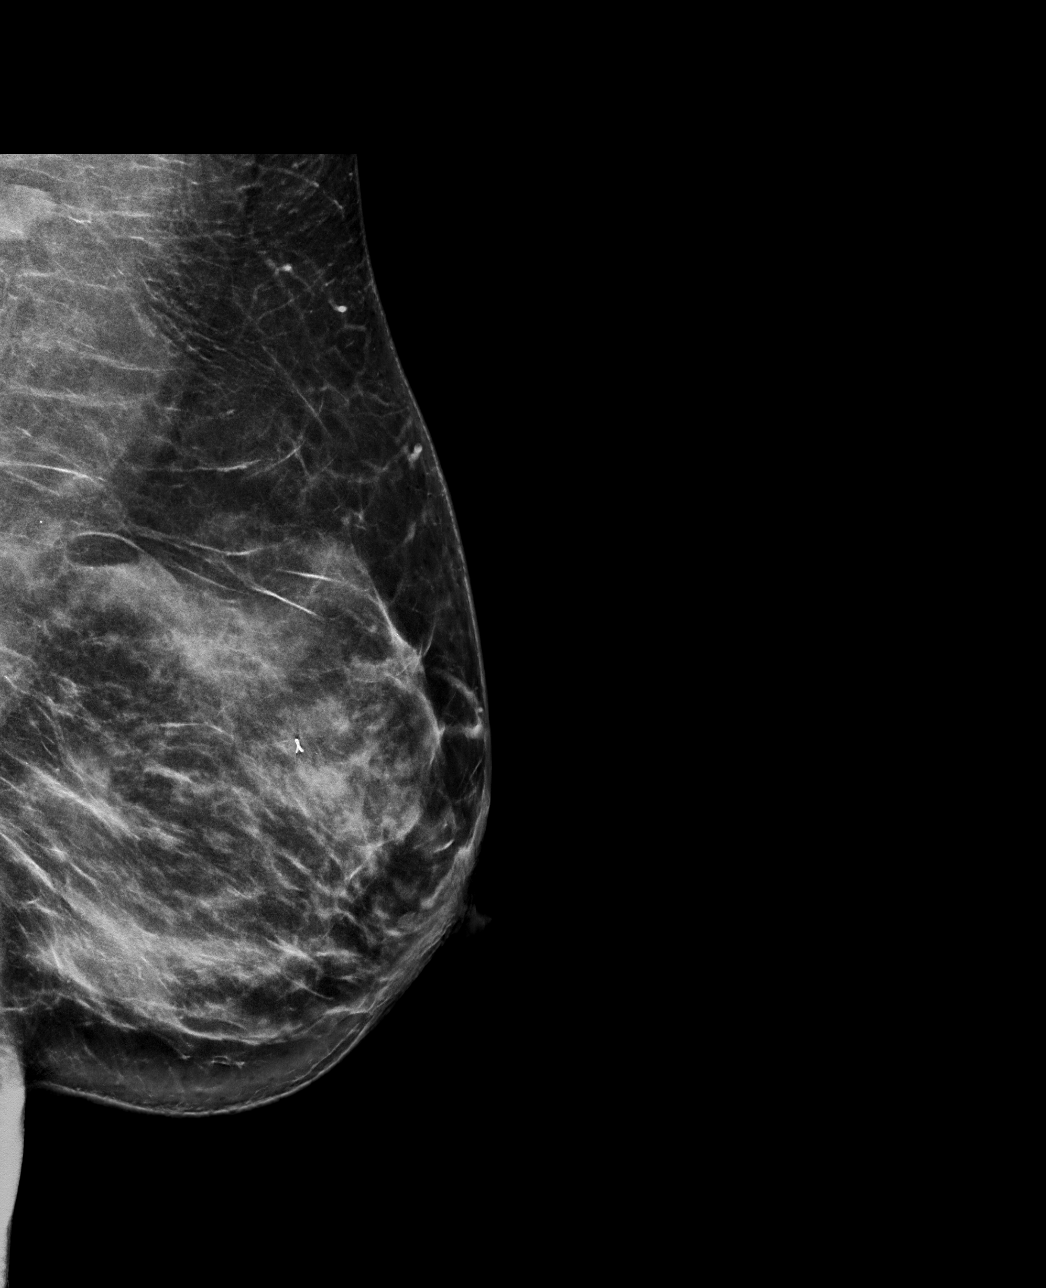

[L CC synth-2D]
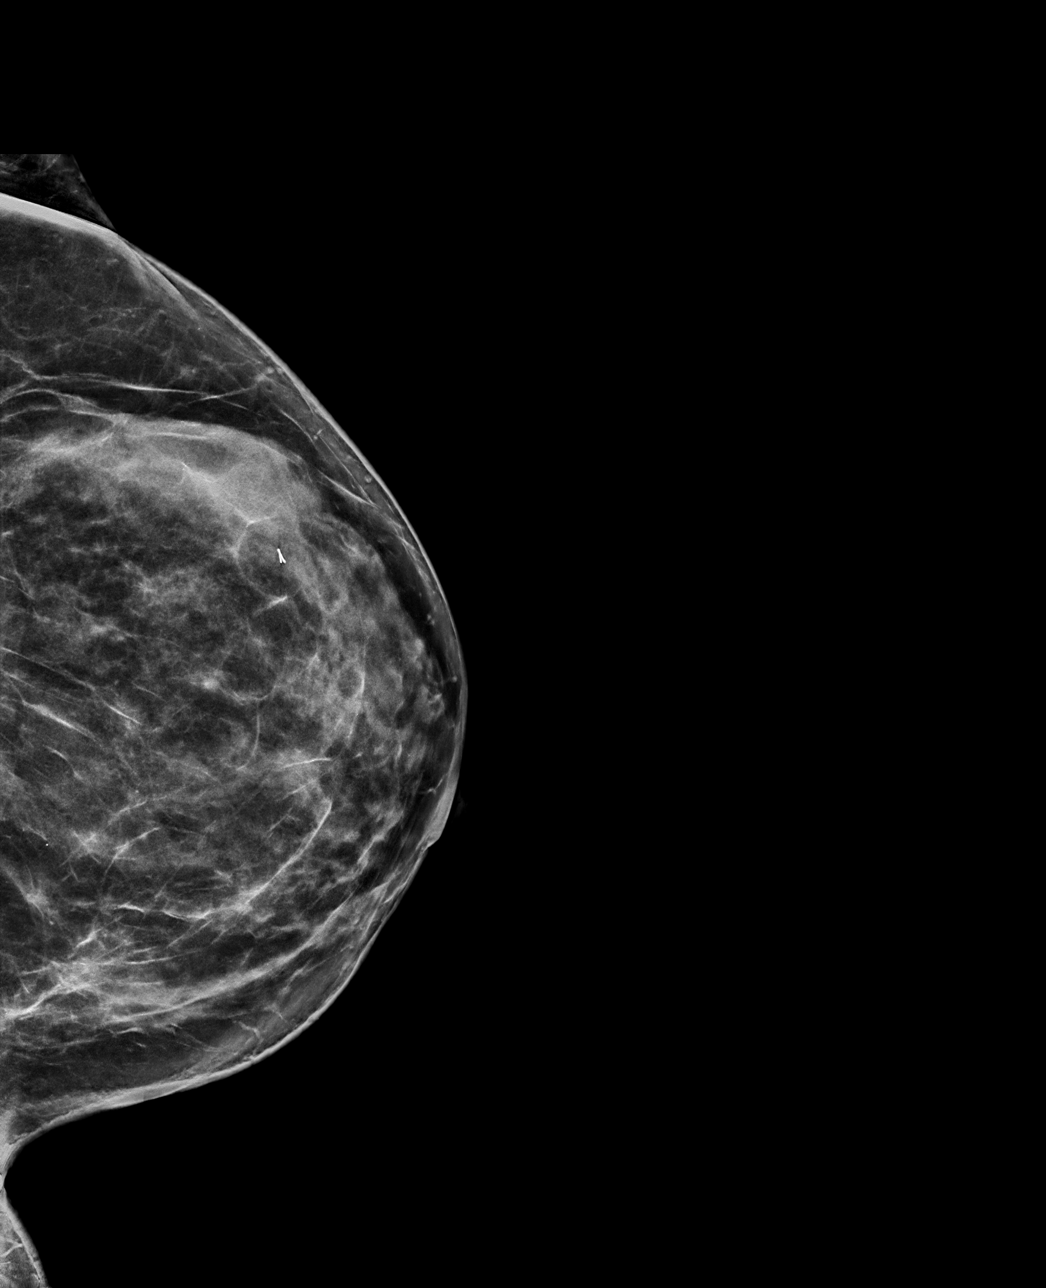

[L MLO tomo · tomo slice 39/77.0]
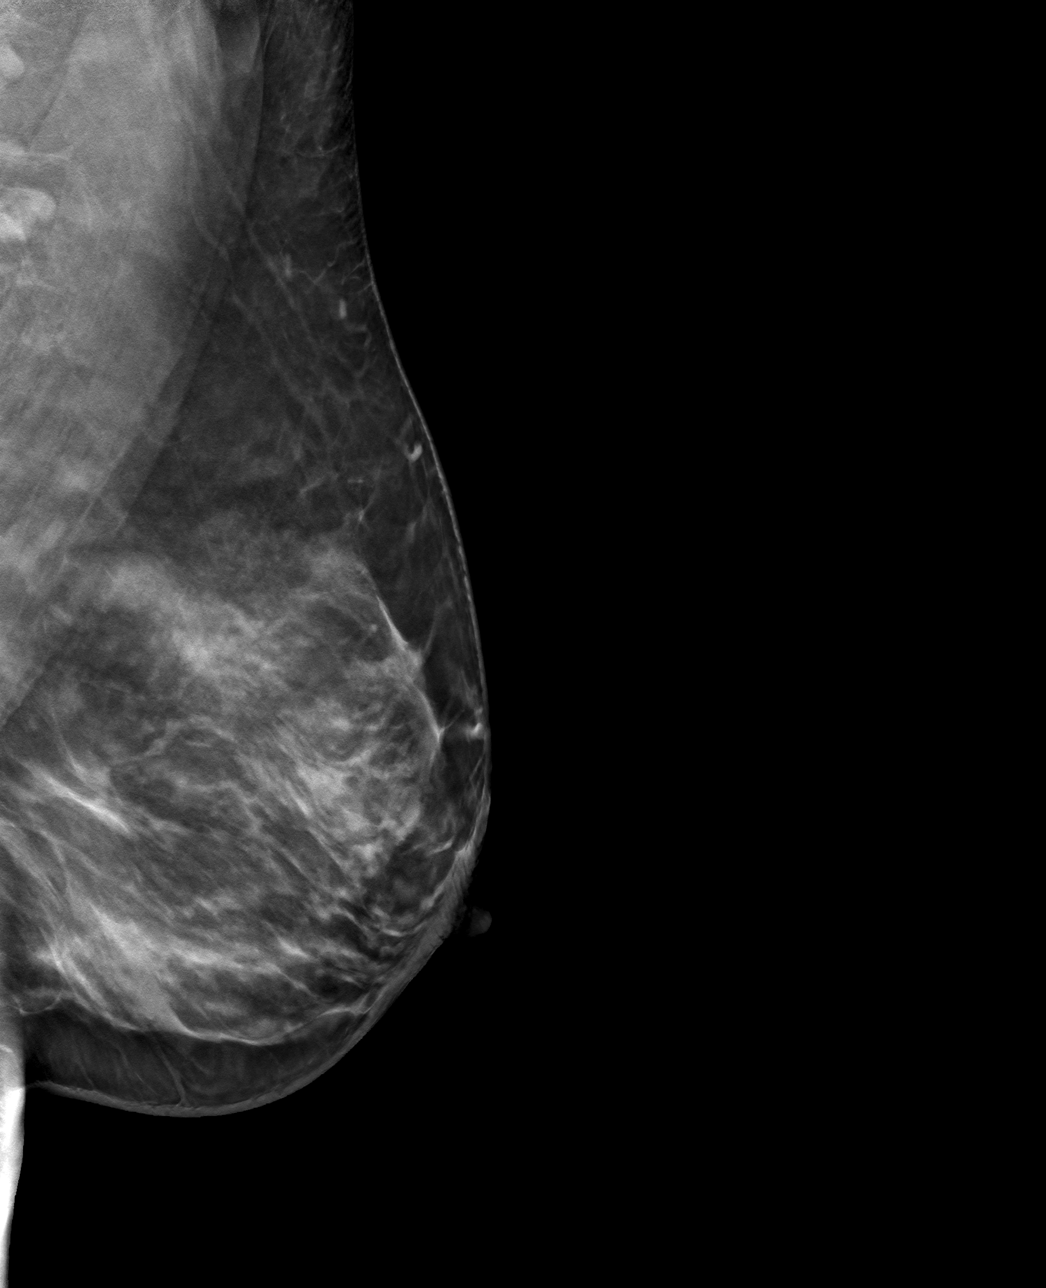

[L CC tomo · tomo slice 37/74.0]
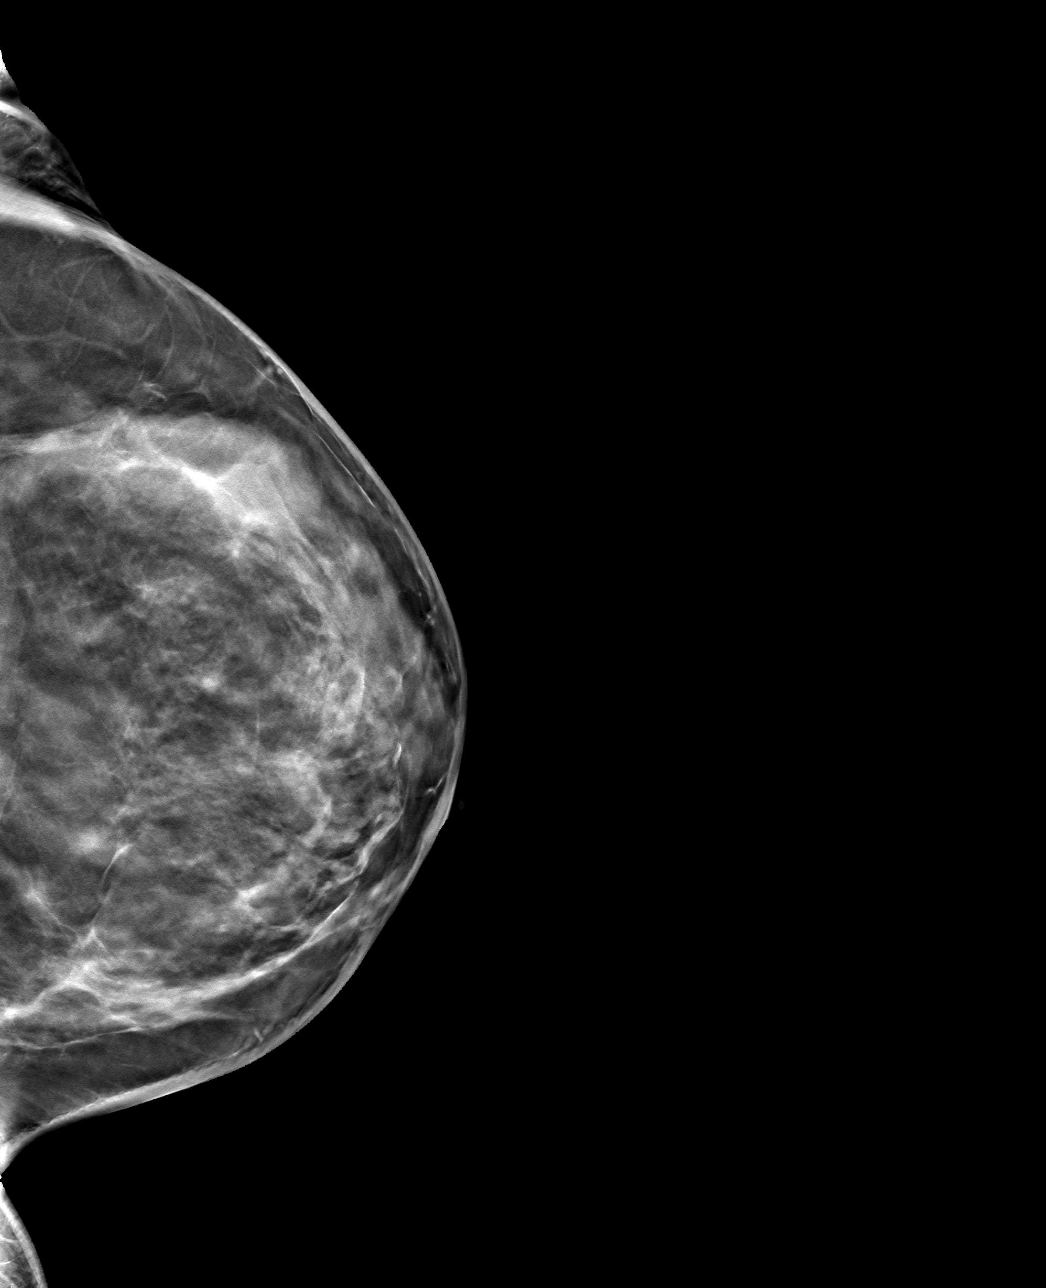

[4 of 12 positions shown; findings below may reference images not displayed]

ACR Breast Density Category c: The breast tissue is heterogeneously
dense, which may obscure small masses.
FINDINGS: No suspicious mammographic findings are identified. The parenchymal
pattern is stable. Post biopsy clip is again identified in the upper
outer quadrant.

Mammographic images were processed with CAD.
IMPRESSION: No mammographic evidence of malignancy.

RECOMMENDATION:
1. Clinical follow-up recommended for the painful area of concern in
the left breast. Any further workup should be based on clinical
grounds.
2. Patient is due for annual bilateral screening in April 2018.

I have discussed the findings and recommendations with the patient.
Results were also provided in writing at the conclusion of the
visit. If applicable, a reminder letter will be sent to the patient
regarding the next appointment.

BI-RADS CATEGORY  1: Negative.

## 2018-03-08 DIAGNOSIS — H8110 Benign paroxysmal vertigo, unspecified ear: Secondary | ICD-10-CM | POA: Diagnosis not present

## 2018-03-08 DIAGNOSIS — R079 Chest pain, unspecified: Secondary | ICD-10-CM | POA: Diagnosis not present

## 2018-03-08 DIAGNOSIS — H8102 Meniere's disease, left ear: Secondary | ICD-10-CM | POA: Diagnosis not present

## 2018-03-10 ENCOUNTER — Other Ambulatory Visit: Payer: Self-pay | Admitting: Otolaryngology

## 2018-03-10 DIAGNOSIS — H8102 Meniere's disease, left ear: Secondary | ICD-10-CM

## 2018-03-10 DIAGNOSIS — H8110 Benign paroxysmal vertigo, unspecified ear: Secondary | ICD-10-CM

## 2018-03-10 MED FILL — FREESTYLE LITE TEST STRIP: 50 days supply | Qty: 50 | Fill #0

## 2018-03-18 ENCOUNTER — Other Ambulatory Visit: Payer: 59

## 2018-04-09 MED FILL — ENALAPRIL MALEATE 5 MG TABS: 5 | 90 days supply | Qty: 90 | Fill #0

## 2018-04-25 ENCOUNTER — Ambulatory Visit: Payer: Self-pay

## 2018-06-20 ENCOUNTER — Ambulatory Visit: Payer: Self-pay

## 2018-07-15 MED FILL — ENALAPRIL MALEATE 5 MG TABS: 5 | 90 days supply | Qty: 90 | Fill #1

## 2018-07-25 MED FILL — CEPHALEXIN 500 MG CAPSULE: 500 | 10 days supply | Qty: 20 | Fill #0

## 2018-08-01 ENCOUNTER — Ambulatory Visit
Admission: RE | Admit: 2018-08-01 | Discharge: 2018-08-01 | Disposition: A | Discharge status: Home / Self Care | Payer: BC Managed Care – PPO | Source: Ambulatory Visit | Attending: Family Medicine | Admitting: Family Medicine

## 2018-08-01 ENCOUNTER — Other Ambulatory Visit: Payer: Self-pay

## 2018-08-01 DIAGNOSIS — Z1231 Encounter for screening mammogram for malignant neoplasm of breast: Secondary | ICD-10-CM

## 2018-08-01 IMAGING — MG DIGITAL SCREENING BILATERAL MAMMOGRAM WITH TOMO AND CAD
8 series · 8 of 24 positions shown · non-contrast
Comparison: Previous exam(s).

CLINICAL DATA: Screening.

EXAM:
DIGITAL SCREENING BILATERAL MAMMOGRAM WITH TOMO AND CAD

[L MLO synth-2D]
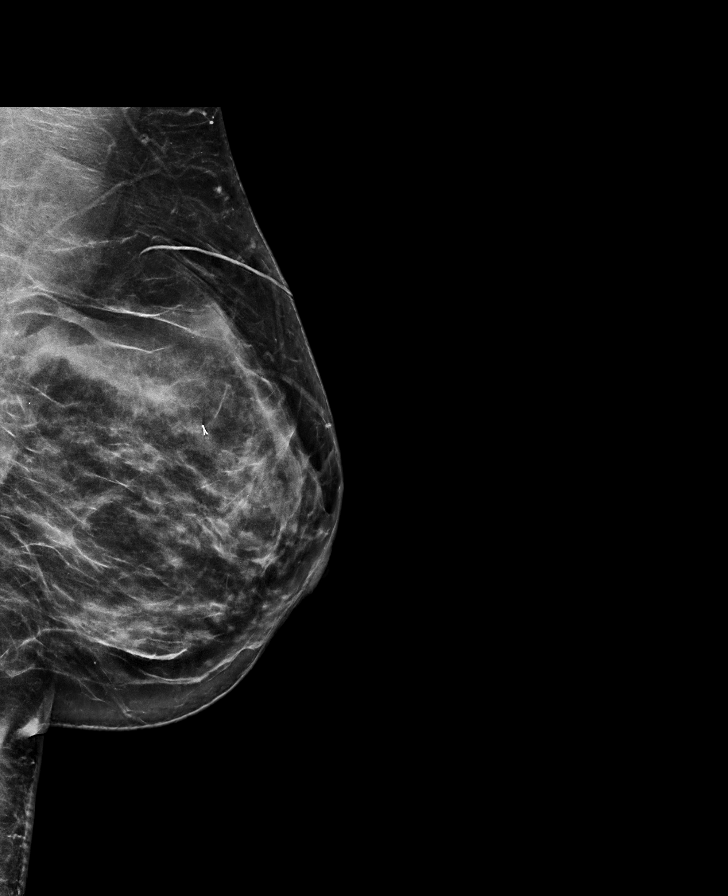

[R CC synth-2D]
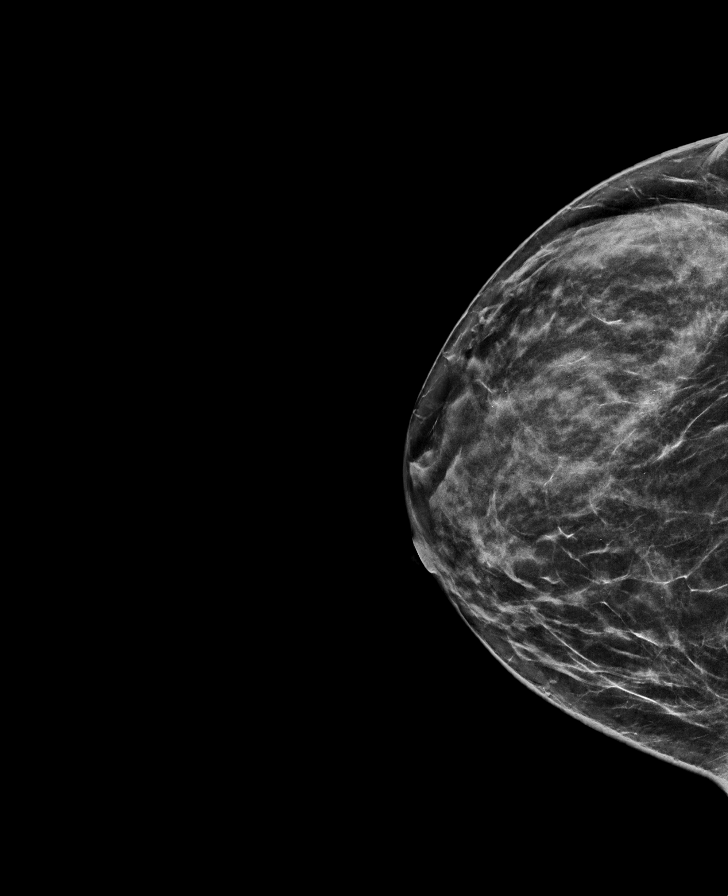

[R MLO synth-2D]
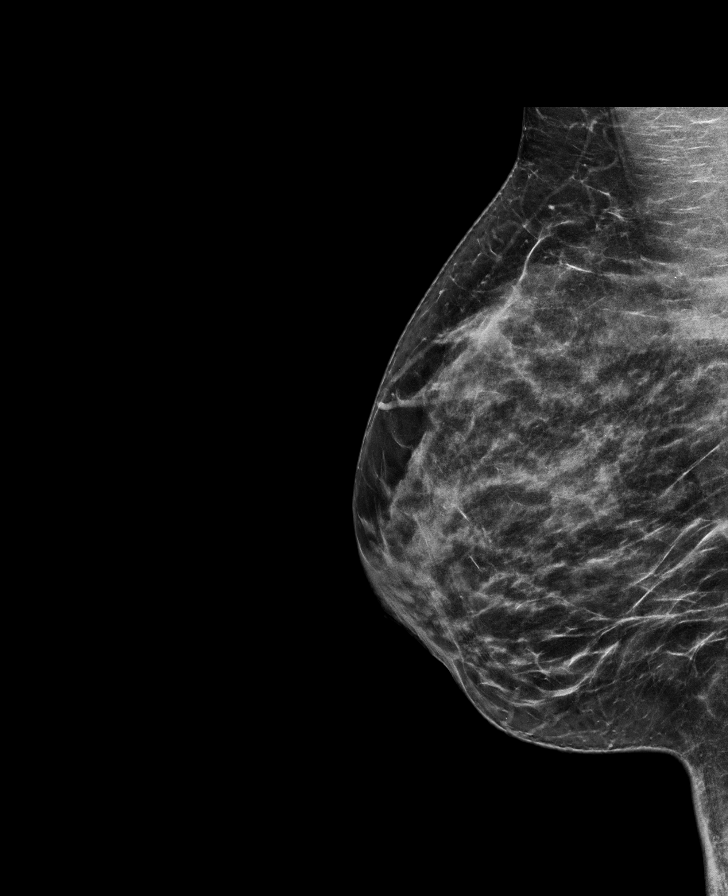

[L CC synth-2D]
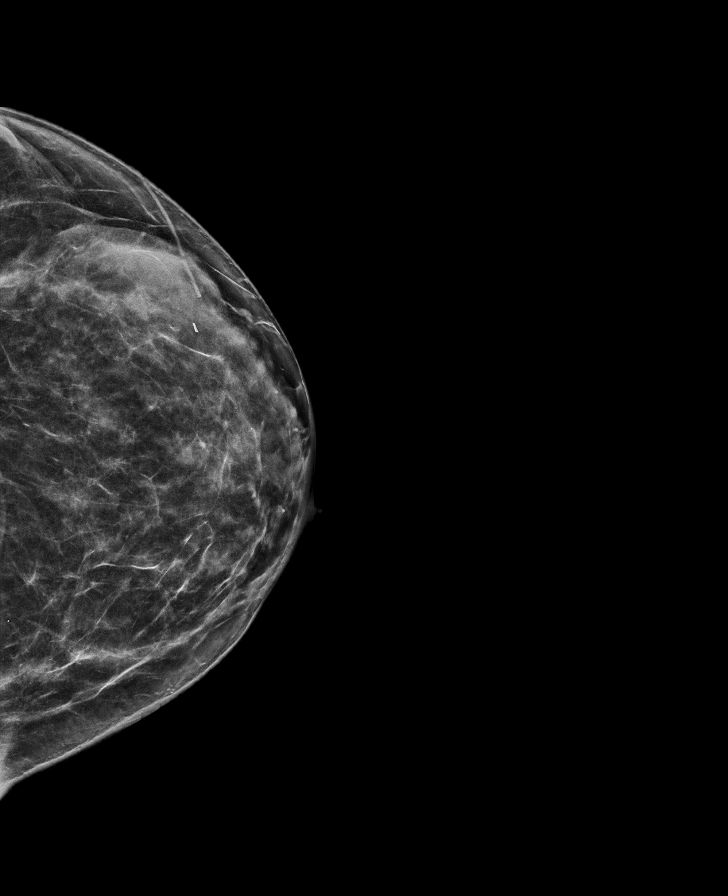

[L CC tomo · tomo slice 33/66.0]
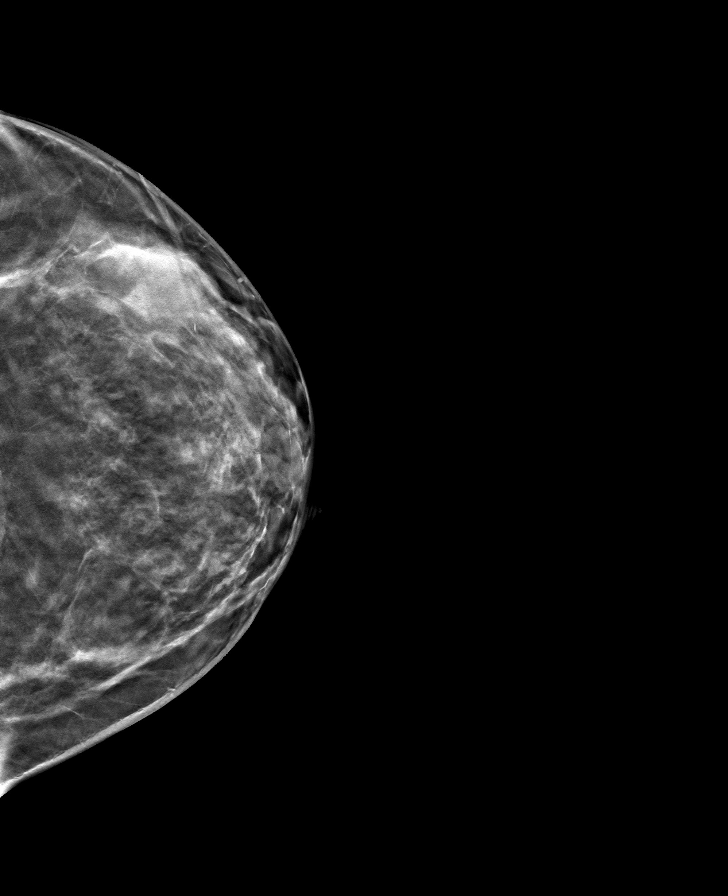

[R CC tomo · tomo slice 31/62.0]
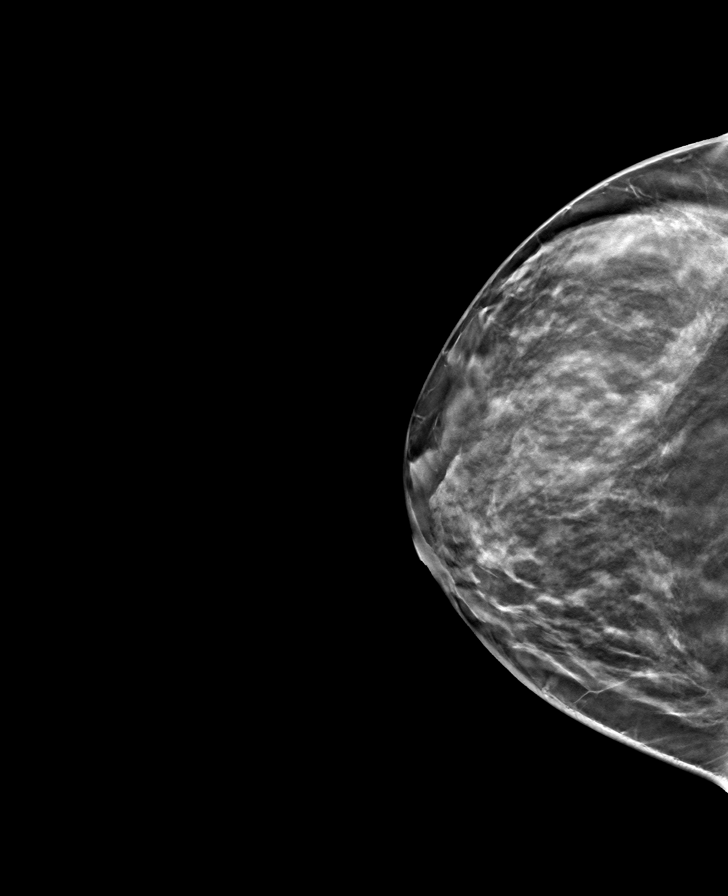

[R MLO tomo · tomo slice 33/65.0]
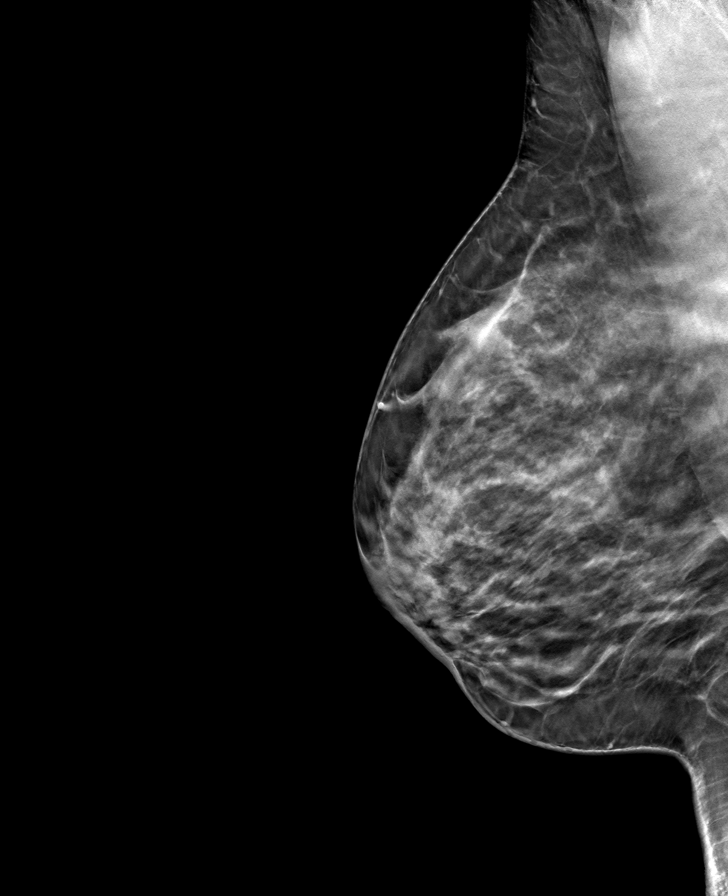

[L MLO tomo · tomo slice 37/74.0]
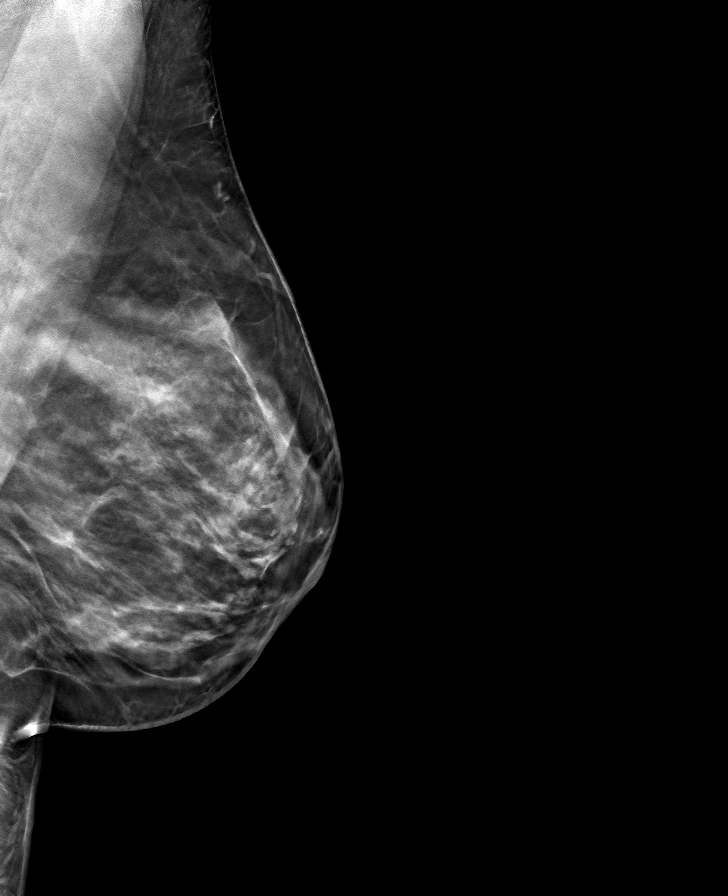

[8 of 24 positions shown; findings below may reference images not displayed]

ACR Breast Density Category c: The breast tissue is heterogeneously
dense, which may obscure small masses.
FINDINGS: There are no findings suspicious for malignancy. Images were
processed with CAD.
IMPRESSION: No mammographic evidence of malignancy. A result letter of this
screening mammogram will be mailed directly to the patient.

RECOMMENDATION:
Screening mammogram in one year. (Code:[id number])

BI-RADS CATEGORY  1: Negative.

## 2018-08-12 DIAGNOSIS — E119 Type 2 diabetes mellitus without complications: Secondary | ICD-10-CM | POA: Diagnosis not present

## 2018-08-12 DIAGNOSIS — Z7984 Long term (current) use of oral hypoglycemic drugs: Secondary | ICD-10-CM | POA: Diagnosis not present

## 2018-08-12 DIAGNOSIS — Z0000 Encounter for general adult medical examination without abnormal findings: Secondary | ICD-10-CM | POA: Diagnosis not present

## 2018-08-12 DIAGNOSIS — E7800 Pure hypercholesterolemia, unspecified: Secondary | ICD-10-CM | POA: Diagnosis not present

## 2018-09-23 MED FILL — ENALAPRIL MALEATE 10 MG TAB: 10 | 90 days supply | Qty: 90 | Fill #0

## 2018-09-26 MED FILL — ENALAPRIL MALEATE 5 MG TABS: 5 | 90 days supply | Qty: 90 | Fill #2

## 2018-10-10 DIAGNOSIS — E119 Type 2 diabetes mellitus without complications: Secondary | ICD-10-CM | POA: Diagnosis not present

## 2018-10-10 DIAGNOSIS — H35413 Lattice degeneration of retina, bilateral: Secondary | ICD-10-CM | POA: Diagnosis not present

## 2018-10-10 DIAGNOSIS — H04123 Dry eye syndrome of bilateral lacrimal glands: Secondary | ICD-10-CM | POA: Diagnosis not present

## 2018-10-10 DIAGNOSIS — H1045 Other chronic allergic conjunctivitis: Secondary | ICD-10-CM | POA: Diagnosis not present

## 2018-10-10 DIAGNOSIS — H40023 Open angle with borderline findings, high risk, bilateral: Secondary | ICD-10-CM | POA: Diagnosis not present

## 2018-10-11 ENCOUNTER — Encounter (INDEPENDENT_AMBULATORY_CARE_PROVIDER_SITE_OTHER): Payer: BC Managed Care – PPO | Admitting: Ophthalmology

## 2018-10-11 ENCOUNTER — Other Ambulatory Visit: Payer: Self-pay

## 2018-10-11 DIAGNOSIS — I10 Essential (primary) hypertension: Secondary | ICD-10-CM | POA: Diagnosis not present

## 2018-10-11 DIAGNOSIS — H35033 Hypertensive retinopathy, bilateral: Secondary | ICD-10-CM

## 2018-10-11 DIAGNOSIS — H43813 Vitreous degeneration, bilateral: Secondary | ICD-10-CM | POA: Diagnosis not present

## 2018-10-11 DIAGNOSIS — H35413 Lattice degeneration of retina, bilateral: Secondary | ICD-10-CM | POA: Diagnosis not present

## 2018-10-11 DIAGNOSIS — H33302 Unspecified retinal break, left eye: Secondary | ICD-10-CM

## 2018-10-18 ENCOUNTER — Encounter (INDEPENDENT_AMBULATORY_CARE_PROVIDER_SITE_OTHER): Payer: BC Managed Care – PPO | Admitting: Ophthalmology

## 2018-10-18 DIAGNOSIS — H33023 Retinal detachment with multiple breaks, bilateral: Secondary | ICD-10-CM

## 2018-10-26 ENCOUNTER — Encounter (INDEPENDENT_AMBULATORY_CARE_PROVIDER_SITE_OTHER): Payer: BC Managed Care – PPO | Admitting: Ophthalmology

## 2018-10-26 DIAGNOSIS — H33303 Unspecified retinal break, bilateral: Secondary | ICD-10-CM

## 2018-11-10 DIAGNOSIS — H1045 Other chronic allergic conjunctivitis: Secondary | ICD-10-CM | POA: Diagnosis not present

## 2018-11-10 DIAGNOSIS — H04123 Dry eye syndrome of bilateral lacrimal glands: Secondary | ICD-10-CM | POA: Diagnosis not present

## 2018-11-18 DIAGNOSIS — J029 Acute pharyngitis, unspecified: Secondary | ICD-10-CM | POA: Diagnosis not present

## 2018-11-18 DIAGNOSIS — J302 Other seasonal allergic rhinitis: Secondary | ICD-10-CM | POA: Diagnosis not present

## 2018-12-12 DIAGNOSIS — H04123 Dry eye syndrome of bilateral lacrimal glands: Secondary | ICD-10-CM | POA: Diagnosis not present

## 2018-12-12 DIAGNOSIS — H1045 Other chronic allergic conjunctivitis: Secondary | ICD-10-CM | POA: Diagnosis not present

## 2018-12-12 DIAGNOSIS — H40023 Open angle with borderline findings, high risk, bilateral: Secondary | ICD-10-CM | POA: Diagnosis not present

## 2018-12-23 MED FILL — ENALAPRIL MALEATE 10 MG TAB: 10 | 90 days supply | Qty: 90 | Fill #1

## 2019-02-28 ENCOUNTER — Encounter (INDEPENDENT_AMBULATORY_CARE_PROVIDER_SITE_OTHER): Payer: BC Managed Care – PPO | Admitting: Ophthalmology

## 2019-02-28 DIAGNOSIS — H33303 Unspecified retinal break, bilateral: Secondary | ICD-10-CM | POA: Diagnosis not present

## 2019-02-28 DIAGNOSIS — H43813 Vitreous degeneration, bilateral: Secondary | ICD-10-CM

## 2019-04-01 ENCOUNTER — Other Ambulatory Visit: Payer: Self-pay

## 2019-04-01 ENCOUNTER — Ambulatory Visit: Payer: BC Managed Care – PPO | Attending: Internal Medicine

## 2019-04-01 DIAGNOSIS — Z23 Encounter for immunization: Secondary | ICD-10-CM

## 2019-04-01 NOTE — Progress Notes (Signed)
   Covid-19 Vaccination Clinic  Name:  Susan Wilkerson    MRN: [id number] DOB: 1970/06/16  04/01/2019  [name] was observed post Covid-19 immunization for 15 minutes without incident. She was provided with Vaccine Information Sheet and instruction to access the V-Safe system.   [name] was instructed to call 911 with any severe reactions post vaccine: Marland Kitchen Difficulty breathing  . Swelling of face and throat  . A fast heartbeat  . A bad rash all over body  . Dizziness and weakness   Immunizations Administered    Name Date Dose VIS Date Route   Pfizer COVID-19 Vaccine 04/01/2019  8:50 AM 0.3 mL 12/23/2018 Intramuscular   Manufacturer: [place]   Lot: [id number]   [place]: [id number]

## 2019-04-25 ENCOUNTER — Ambulatory Visit: Payer: BC Managed Care – PPO | Attending: Internal Medicine

## 2019-04-25 DIAGNOSIS — Z23 Encounter for immunization: Secondary | ICD-10-CM

## 2019-04-25 NOTE — Progress Notes (Signed)
   Covid-19 Vaccination Clinic  Name:  Susan Wilkerson    MRN: [id number] DOB: 1970-08-03  04/25/2019  Susan Wilkerson was observed post Covid-19 immunization for 15 minutes without incident. She was provided with Vaccine Information Sheet and instruction to access the V-Safe system.   Susan Wilkerson was instructed to call 911 with any severe reactions post vaccine: Marland Kitchen Difficulty breathing  . Swelling of face and throat  . A fast heartbeat  . A bad rash all over body  . Dizziness and weakness   Immunizations Administered    Name Date Dose VIS Date Route   Pfizer COVID-19 Vaccine 04/25/2019 12:44 PM 0.3 mL 12/23/2018 Intramuscular   Manufacturer: [place]   Lot: [id number]   [place]: [id number]

## 2019-06-23 ENCOUNTER — Other Ambulatory Visit: Payer: Self-pay | Admitting: Family Medicine

## 2019-06-23 DIAGNOSIS — Z1231 Encounter for screening mammogram for malignant neoplasm of breast: Secondary | ICD-10-CM

## 2019-08-02 ENCOUNTER — Other Ambulatory Visit: Payer: Self-pay | Admitting: Family Medicine

## 2019-08-02 ENCOUNTER — Ambulatory Visit
Admission: RE | Admit: 2019-08-02 | Discharge: 2019-08-02 | Disposition: A | Discharge status: Home / Self Care | Payer: BC Managed Care – PPO | Source: Ambulatory Visit | Attending: Family Medicine | Admitting: Family Medicine

## 2019-08-02 ENCOUNTER — Other Ambulatory Visit: Payer: Self-pay

## 2019-08-02 DIAGNOSIS — Z1231 Encounter for screening mammogram for malignant neoplasm of breast: Secondary | ICD-10-CM

## 2019-08-02 DIAGNOSIS — N63 Unspecified lump in unspecified breast: Secondary | ICD-10-CM

## 2019-08-07 ENCOUNTER — Ambulatory Visit
Admission: RE | Admit: 2019-08-07 | Discharge: 2019-08-07 | Disposition: A | Discharge status: Home / Self Care | Payer: BC Managed Care – PPO | Source: Ambulatory Visit | Attending: Family Medicine | Admitting: Family Medicine

## 2019-08-07 ENCOUNTER — Other Ambulatory Visit: Payer: Self-pay

## 2019-08-07 DIAGNOSIS — N63 Unspecified lump in unspecified breast: Secondary | ICD-10-CM

## 2019-08-07 IMAGING — US US BREAST*L* LIMITED INC AXILLA
1 series · 3 of 3 positions shown · non-contrast
Comparison: Previous exam(s).

CLINICAL DATA: Patient describes 2 adjacent lumps within the outer
LEFT breast. Patient describes recent injury to outer LEFT breast.
Family history of breast cancer. Previous LEFT breast excisional
biopsy.

EXAM:
DIGITAL DIAGNOSTIC BILATERAL MAMMOGRAM WITH CAD AND TOMO
ULTRASOUND LEFT BREAST

[Series 1: us breast*left* limited inc axilla · 0.06mm/px · 3 of 3 slices shown]
[im 1/3]
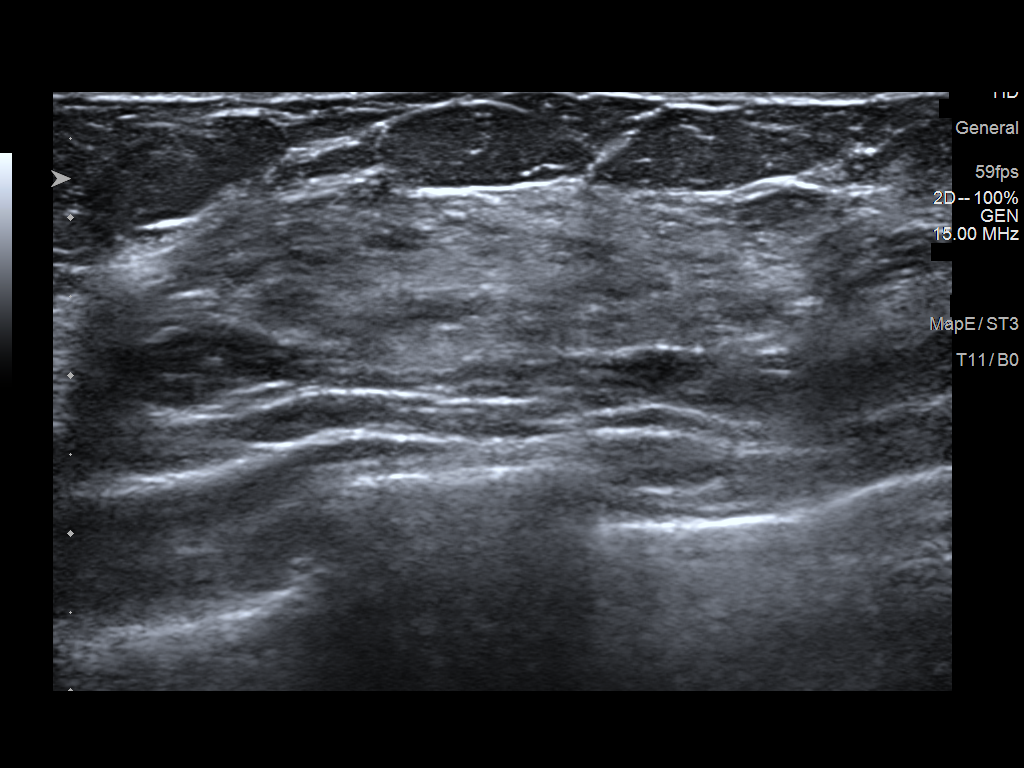
[im 2/3]
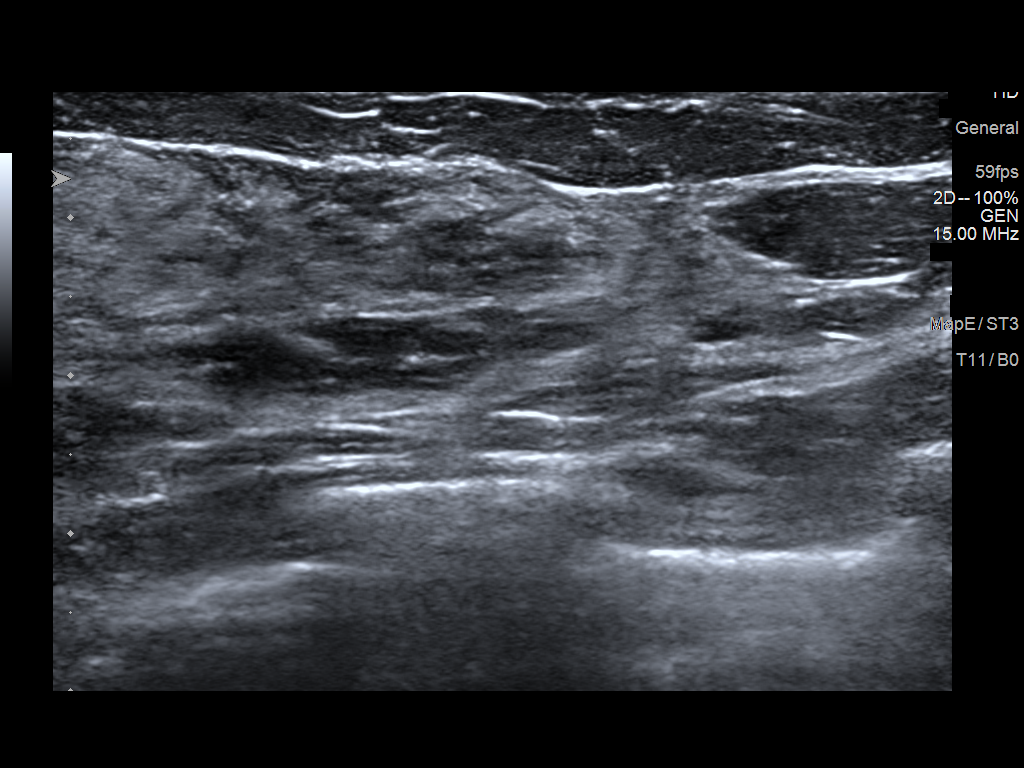
[im 3/3]
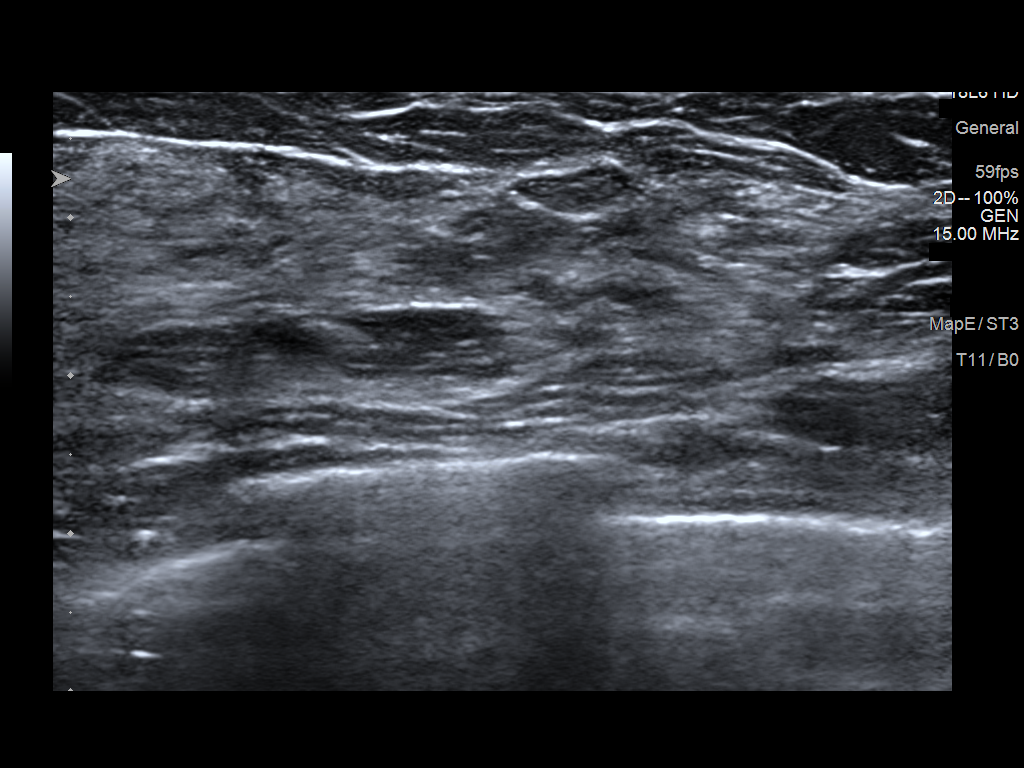

[3 of 3 positions shown; findings below may reference images not displayed]

ACR Breast Density Category c: The breast tissue is heterogeneously
dense, which may obscure small masses.
FINDINGS: There are no new dominant masses, suspicious calcifications or
secondary signs of malignancy within either breast. Specifically,
there is no mammographic abnormality within the outer LEFT breast
corresponding to the area of clinical concern, with overlying skin
marker in place.

Mammographic images were processed with CAD.

Targeted ultrasound is performed, evaluating the outer LEFT breast
with particular attention to the 3 o'clock axis as directed by the
patient, showing only normal dense fibroglandular tissues. No solid
or cystic mass.
IMPRESSION: No evidence of malignancy within either breast. Specifically, no
evidence of malignancy within the outer LEFT breast corresponding to
the area of clinical concern.

RECOMMENDATION:
1.  Screening mammogram in one year.(Code:[id number])
2. The patient was instructed to return sooner if the area that she
feels becomes larger and/or firmer to palpation, or if a new
palpable abnormality is identified in either breast.
3. Given patient's family history of breast cancer and dense
breasts, would consider the addition of annual screening breast MRI
to annual screening mammography. Per American Cancer Society
guidelines, annual screening MRI of the breasts is recommended if a
risk assessment calculation for breast cancer, preferably using the
Tyrer-Cuzick model, measures greater than 20%.

I have discussed the findings and recommendations with the patient.
If applicable, a reminder letter will be sent to the patient
regarding the next appointment.

BI-RADS CATEGORY  1: Negative.

## 2019-08-07 IMAGING — MG DIGITAL DIAGNOSTIC BILAT W/ TOMO W/ CAD
6 of 10 series · 6 of 30 positions shown · non-contrast
Comparison: Previous exam(s).

CLINICAL DATA: Patient describes 2 adjacent lumps within the outer
LEFT breast. Patient describes recent injury to outer LEFT breast.
Family history of breast cancer. Previous LEFT breast excisional
biopsy.

EXAM:
DIGITAL DIAGNOSTIC BILATERAL MAMMOGRAM WITH CAD AND TOMO
ULTRASOUND LEFT BREAST

[L MLO synth-2D]
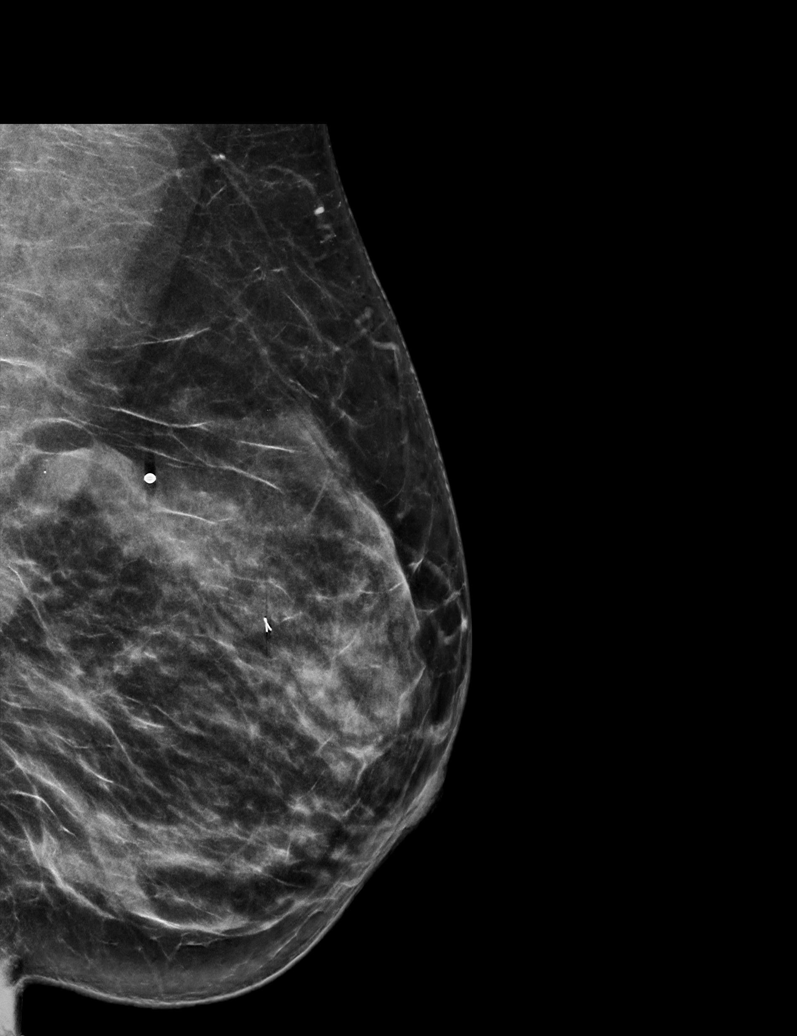

[L TAN synth-2D]
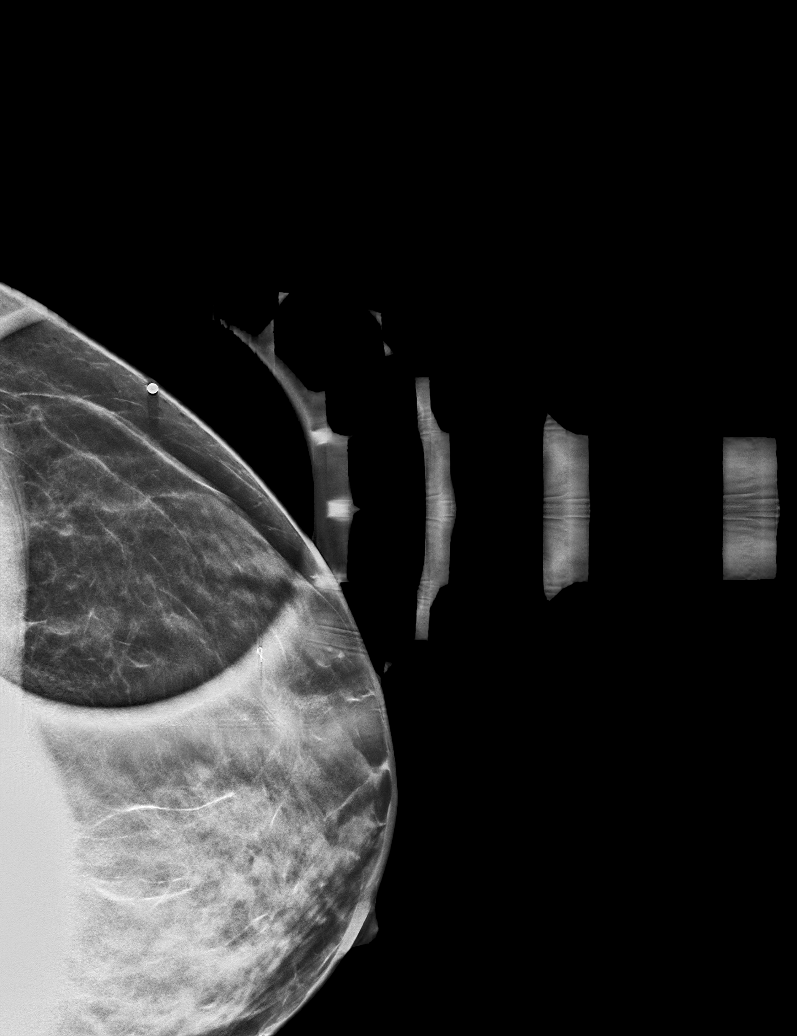

[L CC synth-2D]
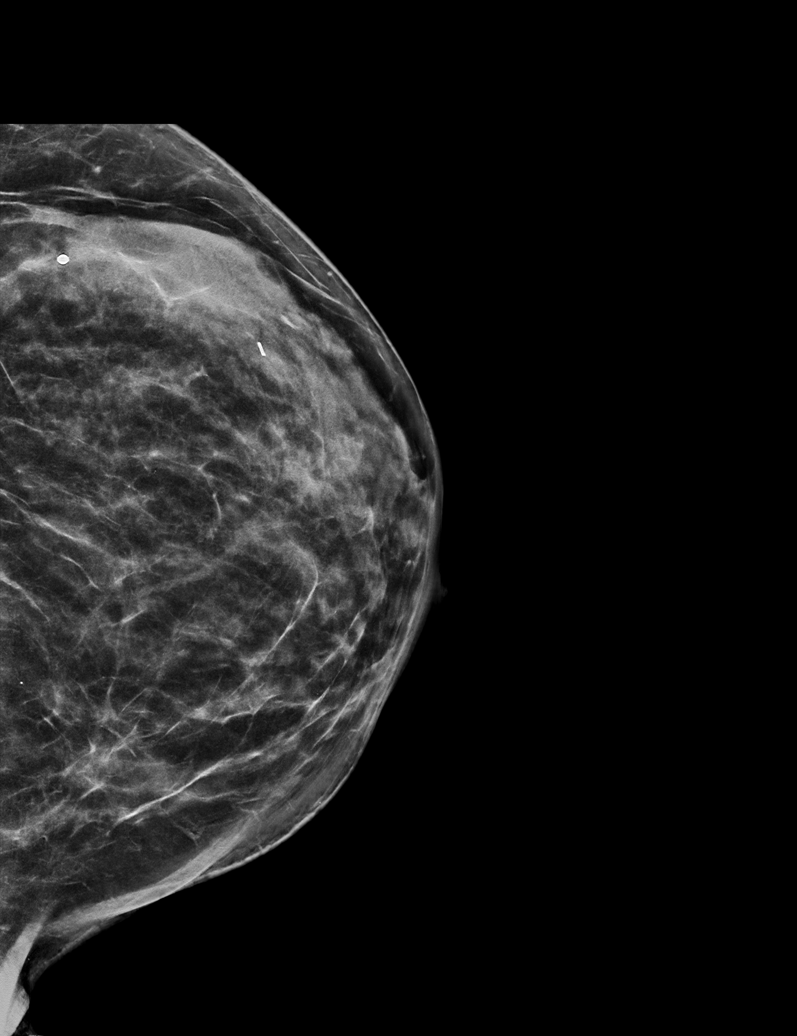

[R MLO synth-2D]
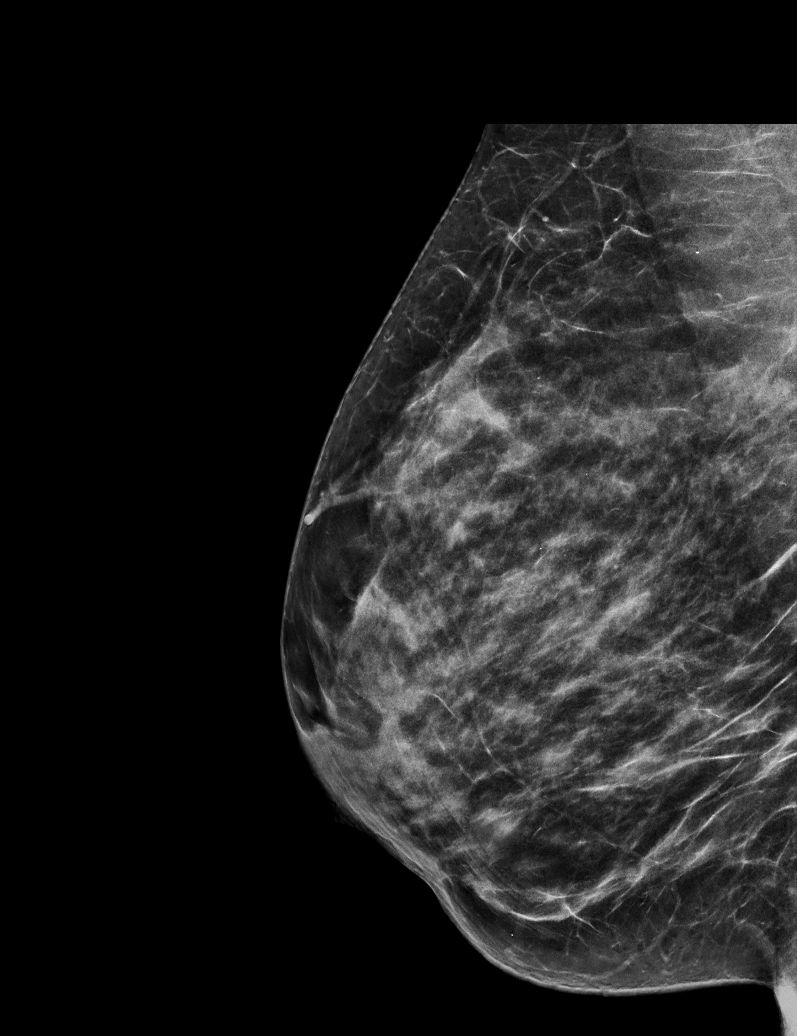

[R CC synth-2D]
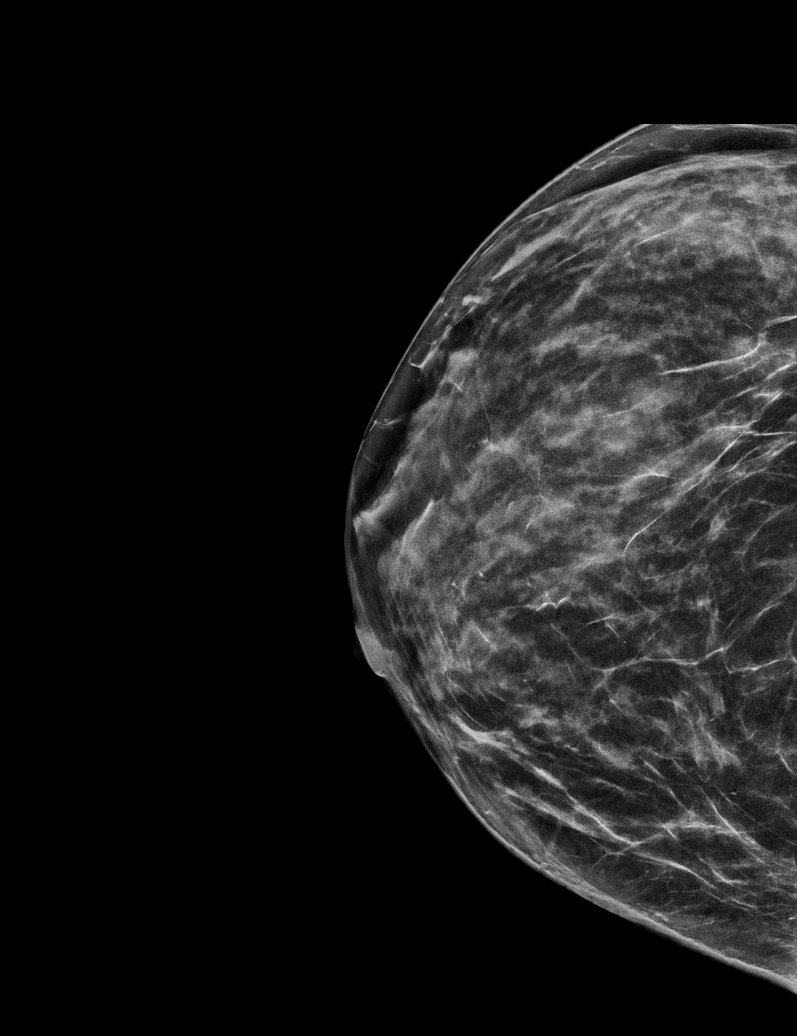

[R MLO tomo · tomo slice 29/58.0]
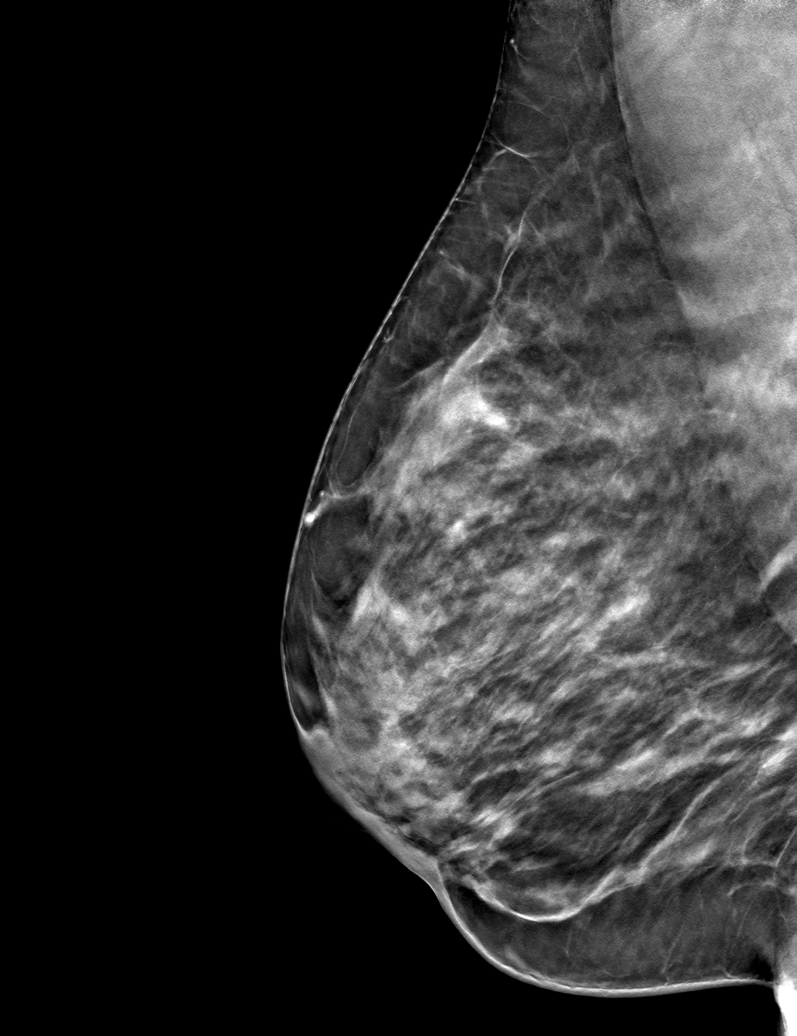

[6 of 30 positions shown; findings below may reference images not displayed]

ACR Breast Density Category c: The breast tissue is heterogeneously
dense, which may obscure small masses.
FINDINGS: There are no new dominant masses, suspicious calcifications or
secondary signs of malignancy within either breast. Specifically,
there is no mammographic abnormality within the outer LEFT breast
corresponding to the area of clinical concern, with overlying skin
marker in place.

Mammographic images were processed with CAD.

Targeted ultrasound is performed, evaluating the outer LEFT breast
with particular attention to the 3 o'clock axis as directed by the
patient, showing only normal dense fibroglandular tissues. No solid
or cystic mass.
IMPRESSION: No evidence of malignancy within either breast. Specifically, no
evidence of malignancy within the outer LEFT breast corresponding to
the area of clinical concern.

RECOMMENDATION:
1.  Screening mammogram in one year.(Code:[id number]-JVD)
2. The patient was instructed to return sooner if the area that she
feels becomes larger and/or firmer to palpation, or if a new
palpable abnormality is identified in either breast.
3. Given patient's family history of breast cancer and dense
breasts, would consider the addition of annual screening breast MRI
to annual screening mammography. Per American Cancer Society
guidelines, annual screening MRI of the breasts is recommended if a
risk assessment calculation for breast cancer, preferably using the
Tyrer-Cuzick model, measures greater than 20%.

I have discussed the findings and recommendations with the patient.
If applicable, a reminder letter will be sent to the patient
regarding the next appointment.

BI-RADS CATEGORY  1: Negative.

## 2020-07-01 ENCOUNTER — Other Ambulatory Visit: Payer: Self-pay | Admitting: Family Medicine

## 2020-07-01 DIAGNOSIS — Z1231 Encounter for screening mammogram for malignant neoplasm of breast: Secondary | ICD-10-CM

## 2020-08-01 ENCOUNTER — Emergency Department (HOSPITAL_COMMUNITY)
Admission: EM | Admit: 2020-08-01 | Discharge: 2020-08-02 | Disposition: A | Discharge status: Home / Self Care | Payer: BC Managed Care – PPO | Attending: Emergency Medicine | Admitting: Emergency Medicine

## 2020-08-01 ENCOUNTER — Emergency Department (HOSPITAL_COMMUNITY): Payer: BC Managed Care – PPO

## 2020-08-01 DIAGNOSIS — R002 Palpitations: Secondary | ICD-10-CM | POA: Diagnosis present

## 2020-08-01 DIAGNOSIS — E119 Type 2 diabetes mellitus without complications: Secondary | ICD-10-CM | POA: Diagnosis not present

## 2020-08-01 LAB — CBC WITH DIFFERENTIAL/PLATELET
Abs Immature Granulocytes: 0.02 10*3/uL (ref 0.00–0.07)
Basophils Absolute: 0 10*3/uL (ref 0.0–0.1)
Basophils Relative: 0 %
Eosinophils Absolute: 0.1 10*3/uL (ref 0.0–0.5)
Eosinophils Relative: 1 %
HCT: 37.4 % (ref 36.0–46.0)
Hemoglobin: 11.8 g/dL — ABNORMAL LOW (ref 12.0–15.0)
Immature Granulocytes: 0 %
Lymphocytes Relative: 34 %
Lymphs Abs: 1.8 10*3/uL (ref 0.7–4.0)
MCH: 27.3 pg (ref 26.0–34.0)
MCHC: 31.6 g/dL (ref 30.0–36.0)
MCV: 86.6 fL (ref 80.0–100.0)
Monocytes Absolute: 0.3 10*3/uL (ref 0.1–1.0)
Monocytes Relative: 7 %
Neutro Abs: 3.1 10*3/uL (ref 1.7–7.7)
Neutrophils Relative %: 58 %
Platelets: 271 10*3/uL (ref 150–400)
RBC: 4.32 MIL/uL (ref 3.87–5.11)
RDW: 13.4 % (ref 11.5–15.5)
WBC: 5.3 10*3/uL (ref 4.0–10.5)
nRBC: 0 % (ref 0.0–0.2)

## 2020-08-01 LAB — BASIC METABOLIC PANEL
Anion gap: 4 — ABNORMAL LOW (ref 5–15)
BUN: 11 mg/dL (ref 6–20)
CO2: 27 mmol/L (ref 22–32)
Calcium: 9.6 mg/dL (ref 8.9–10.3)
Chloride: 105 mmol/L (ref 98–111)
Creatinine, Ser: 0.82 mg/dL (ref 0.44–1.00)
GFR, Estimated: >60 mL/min (ref >60)
Glucose, Bld: 104 mg/dL — ABNORMAL HIGH (ref 70–99)
Potassium: 3.7 mmol/L (ref 3.5–5.1)
Sodium: 136 mmol/L (ref 135–145)

## 2020-08-01 LAB — TROPONIN I (HIGH SENSITIVITY)
Troponin I (High Sensitivity): <2 ng/L (ref <18)
Troponin I (High Sensitivity): <2 ng/L (ref <18)

## 2020-08-01 LAB — CBG MONITORING, ED: Glucose-Capillary: 87 mg/dL (ref 70–99)

## 2020-08-01 IMAGING — CR DG CHEST 2V
2 series · 2 of 2 positions shown · non-contrast
Comparison: 06/07/2006

CLINICAL DATA: Heart palpitations intermittently, history diabetes
mellitus

EXAM:
CHEST - 2 VIEW

[chest pa]
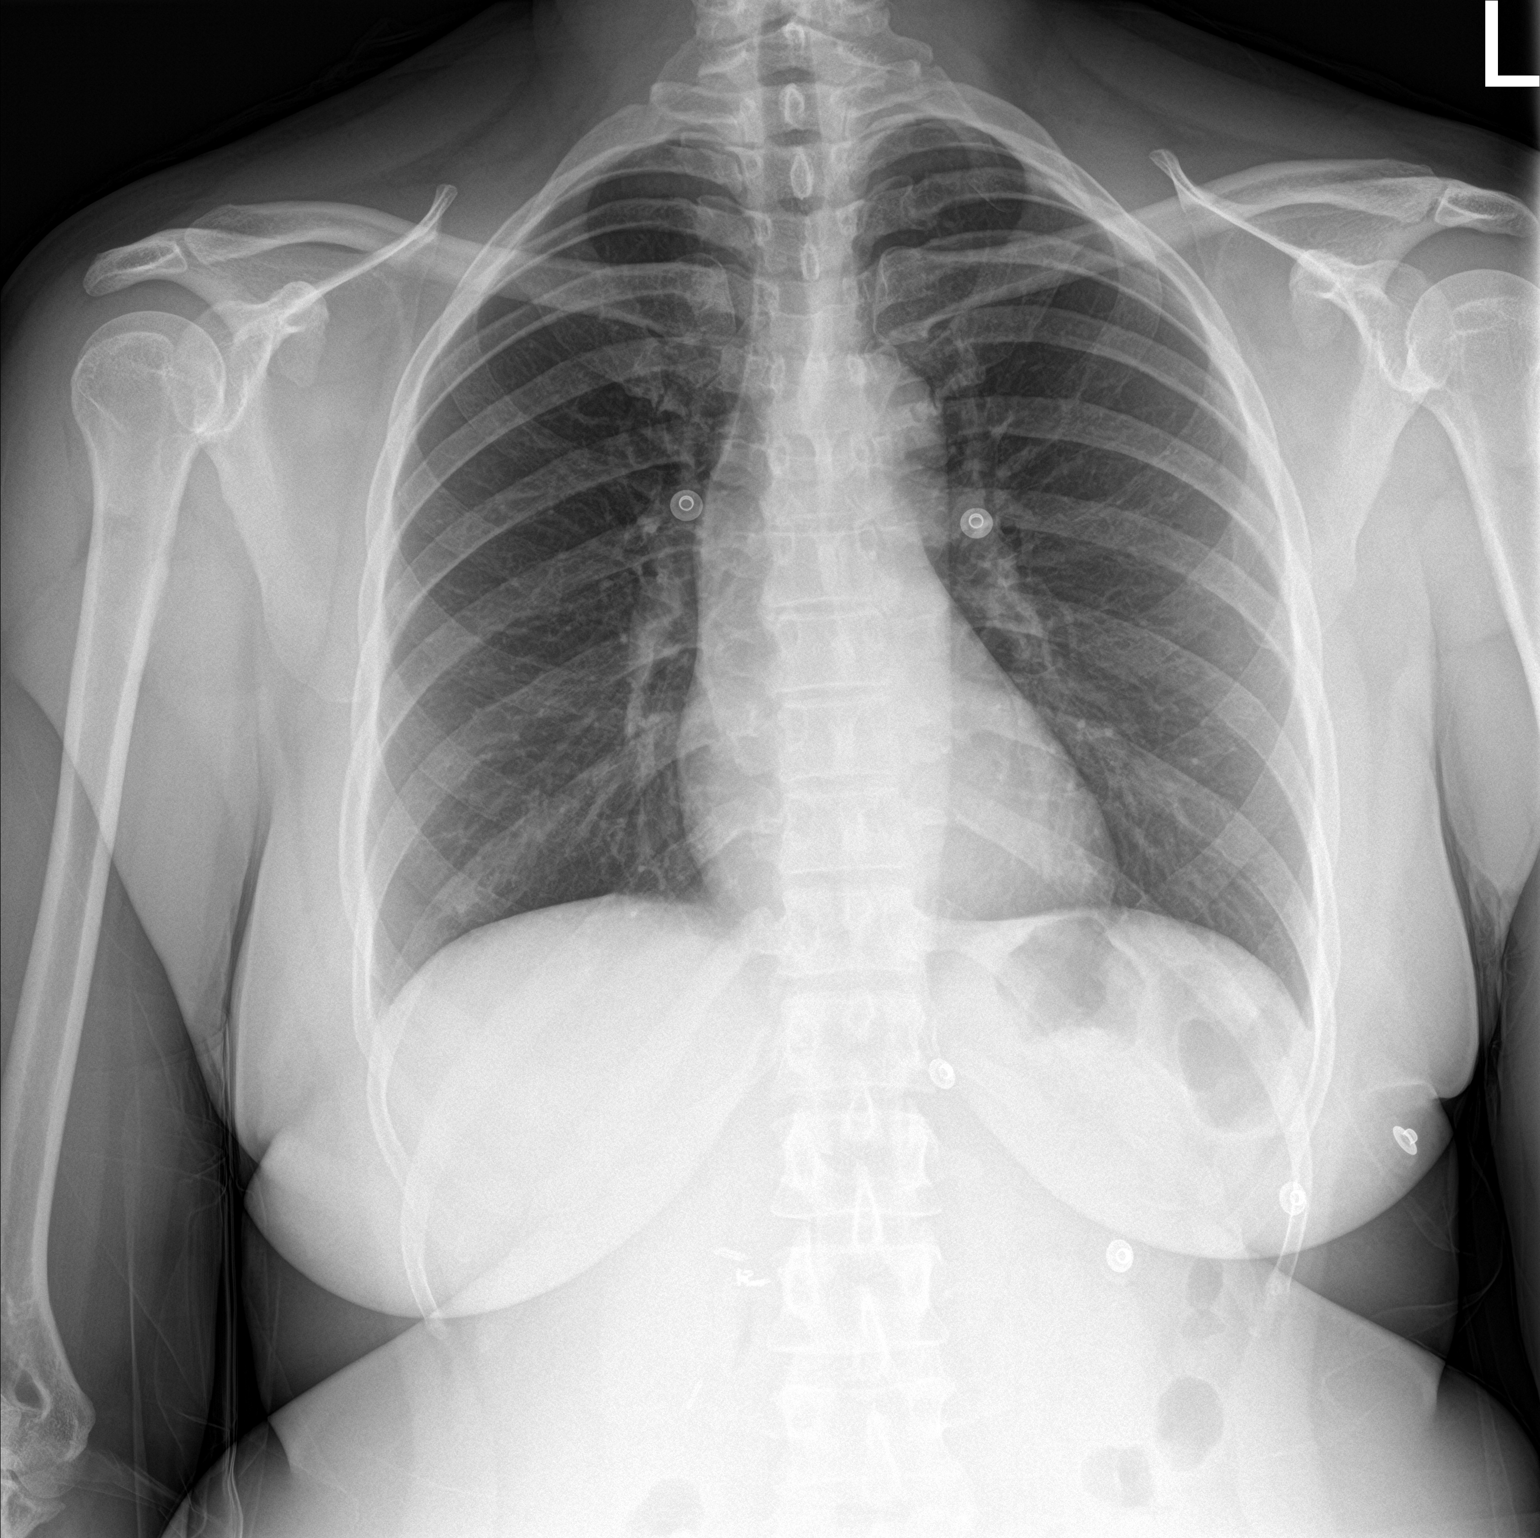

[chest lat]
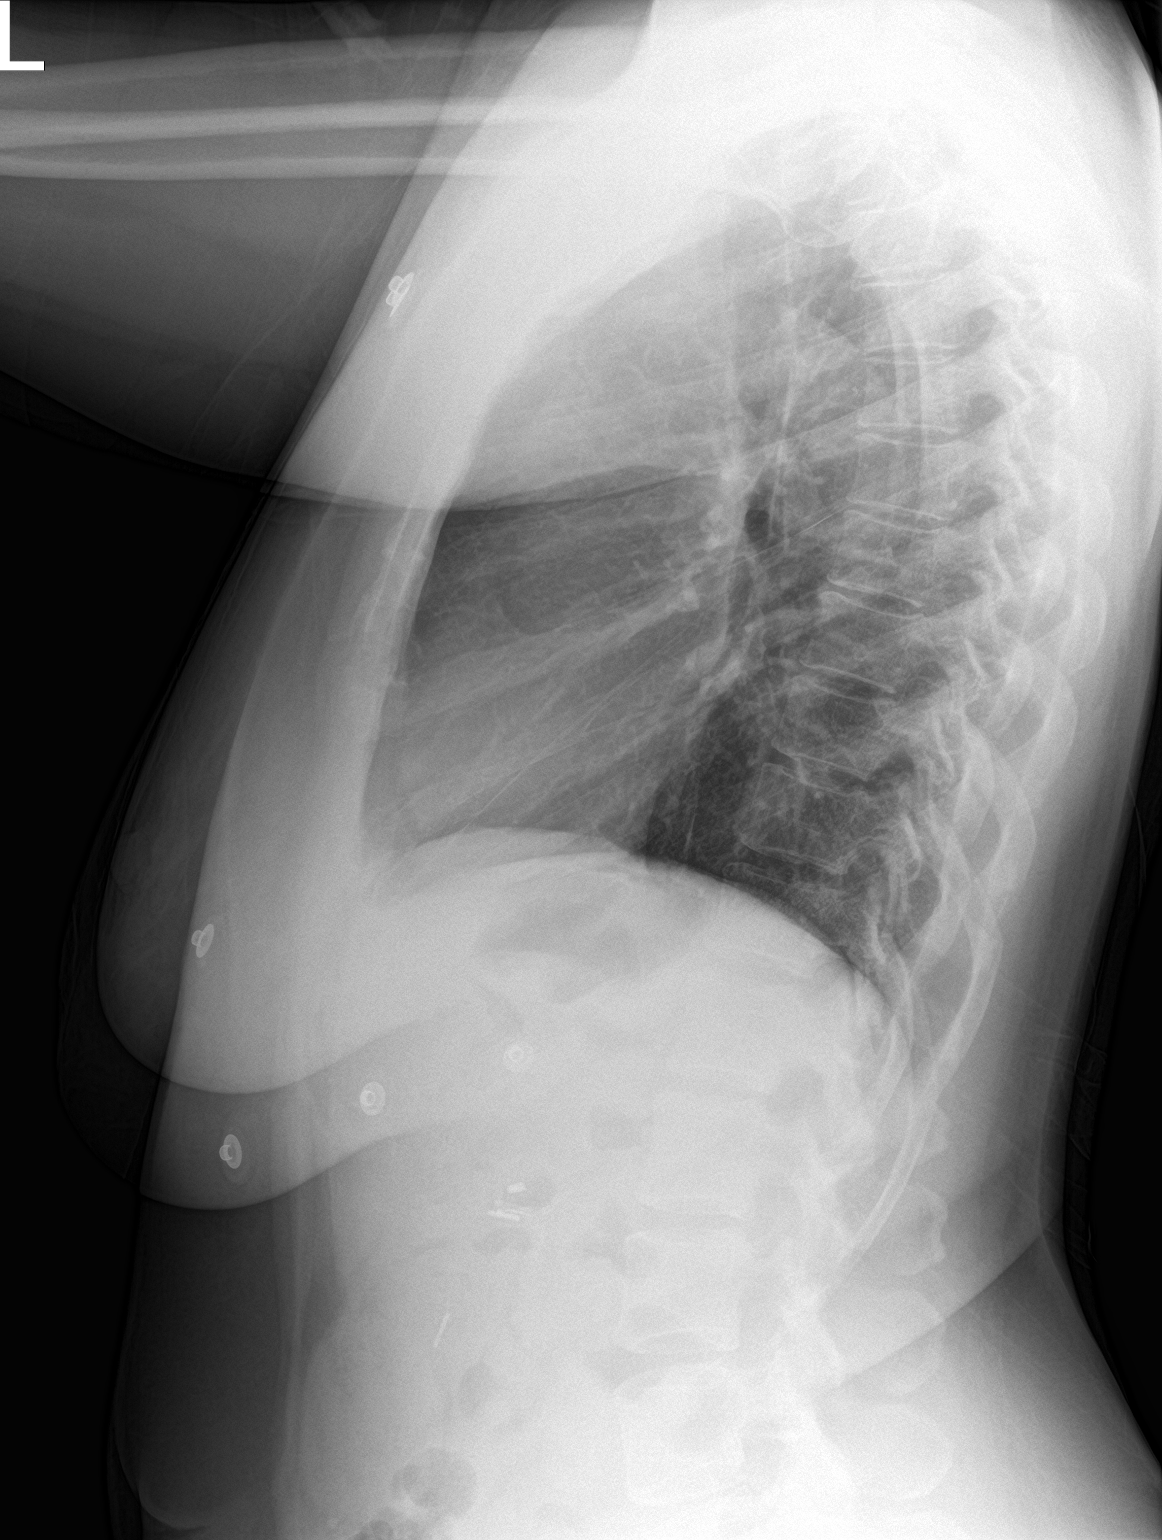

[2 of 2 positions shown; findings below may reference images not displayed]

FINDINGS: Normal heart size, mediastinal contours, and pulmonary vascularity.

Lungs clear.

No pleural effusion or pneumothorax.

BILATERAL cervical ribs, larger on LEFT.
IMPRESSION: No acute abnormalities.

## 2020-08-01 NOTE — ED Provider Notes (Signed)
Emergency Medicine Provider Triage Evaluation Note  Susan Wilkerson , a 50 y.o. female  was evaluated in triage.  Pt complains of patient presents with heart palpitations, they are in intermittent, comes and goes, does not know how long they come on forearms into what happened today, states that took away her breath, she denies any cardiac history, no history of PEs or DVTs, currently not on hormone therapy, denies any leg swelling or leg pain..  Review of Systems  Positive: Heart palpitations, shortness of breath Negative: Chest pain, become diaphoretic  Physical Exam  BP (!) 149/108   Pulse 86   Temp 98.4 F (36.9 C) (Oral)   Resp 16   SpO2 100%  Gen:   Awake, no distress   Resp:  Normal effort  MSK:   Moves extremities without difficulty  Other:    Medical Decision Making  Medically screening exam initiated at 5:41 PM.  Appropriate orders placed.  Marcelino Duster was informed that the remainder of the evaluation will be completed by another provider, this initial triage assessment does not replace that evaluation, and the importance of remaining in the ED until their evaluation is complete.  Heart palpitations, lab work and imaging have been ordered, patient will need further work-up.   Marcello Fennel, PA-C 08/01/20 1742    Milton Ferguson, MD 08/05/20 1041

## 2020-08-01 NOTE — ED Triage Notes (Signed)
Pt bib ems with reports of feeling like heart was racing, near syncope, shob and diaphoretic during episode. Pt states has happened several times this week.  166/92 HR 88 sinus arrhythmia RR 22 100% RA CBG 116

## 2020-08-02 NOTE — ED Provider Notes (Signed)
[facility] EMERGENCY DEPARTMENT Provider Note   CSN: [id number] Arrival date & time: 08/01/20  1712     History Chief Complaint  Patient presents with   Palpitations    Susan Wilkerson is a 50 y.o. female.  Patient presents to ER chief complaint of a sensation of palpitations.  She states that she first had 1 about 5 days ago.  She was at work standing up when she felt her heart racing lightheaded and dizzy and had to sit down.  Symptoms lasted anywhere from 2 to 3 minutes before resolving.  She has not had a recurrent symptom again since yesterday.  Yesterday she was sitting down on her couch getting ready to watch a TV show when she felt lightheaded dizzy and palpitations while sitting down.  Symptoms lasted for 30 seconds and then resolved.  She denies passing out.  Denies any headache or chest pain or abdominal pain.  No reports of fevers or cough or vomiting or diarrhea.      Past Medical History:  Diagnosis Date   Breakthrough bleeding on birth control pills 06/02/2006   history   Diabetes mellitus without complication ([place])    tx - enalapril type II   Endometrial polyp 09/30/2009   Glucosuria 08/30/07   H/O amenorrhea 2001   H/O dysmenorrhea 2005   H/O herpes simplex type 2 infection    H/O vaginal discharge 2010   Headache(784.0)    otc med prn   Hx gestational diabetes 2000   GDM with pregnancy, after delivery it did not go away   Hx: UTI (urinary tract infection) 1997   Irregular periods/menstrual cycles 2011   Lactose intolerance    Spotting 2011   history   SVD (spontaneous vaginal delivery)    x 1   Varicose veins     Patient Active Problem List   Diagnosis Date Noted   Fibroids 03/09/2013   S/P laparoscopic assisted vaginal hysterectomy (LAVH) 03/09/2013   Fibroid 08/24/2011    Past Surgical History:  Procedure Laterality Date   ABDOMINAL HYSTERECTOMY N/A 03/09/2013   Procedure: laporoscopic vaginal hysterectomy;  Surgeon:  Ena Dawley, MD;  Location: [facility];  Service: Gynecology;  Laterality: N/A;   BREAST BIOPSY  06/22/2006   BREAST EXCISIONAL BIOPSY Left 2008   benign   BREAST EXCISIONAL BIOPSY Left 1990   benign   BREAST SURGERY     right side x 3   CHOLECYSTECTOMY  2005   DILATION AND CURETTAGE OF UTERUS     polyp   LUMP REMOVED FROM RIGHT BREAST  1990   WISDOM TOOTH EXTRACTION       OB History     Gravida  1   Para  1   Term  1   Preterm      AB      Living  1      SAB      IAB      Ectopic      Multiple      Live Births  1           Family History  Problem Relation Age of Onset   Hypertension Mother    Diabetes Mother    Cancer Mother        Uterine   Diabetes Father    Cancer Father        Mouth , lung , & stomach   Alcohol abuse Father    Hypertension Sister    Diabetes  Sister        Four sisters  - all diabetic   Learning disabilities Sister        Schizophrenia   Drug abuse Sister        Cocaine   Breast cancer Sister    Cancer Paternal Aunt        Stomach & lung   Cancer Paternal Uncle        Lung   Diabetes Maternal Grandmother    Breast cancer Cousin     Social History   Tobacco Use   Smoking status: Never   Smokeless tobacco: Never  Substance Use Topics   Alcohol use: No   Drug use: No    Home Medications Prior to Admission medications   Medication Sig Start Date End Date Taking? Authorizing Provider  Biotin 5000 MCG TABS Take by mouth every other day.    [provider]  cholecalciferol (VITAMIN D) 1000 UNITS tablet Take 1,000 Units by mouth daily as needed.     [provider]  docusate sodium (COLACE) 100 MG capsule Take 1 capsule (100 mg total) by mouth 2 (two) times daily. 03/10/13   Ena Dawley, MD  enalapril (VASOTEC) 5 MG tablet Take 5 mg by mouth daily.    [provider]  ferrous sulfate (FERROUSUL) 325 (65 FE) MG tablet Take 1 tablet (325 mg total) by mouth 2 (two) times daily with a  meal. 03/10/13   Ena Dawley, MD  fluticasone [facility]) 50 MCG/ACT nasal spray Place 1 spray into both nostrils daily. 12/07/17   Raylene Everts, MD  ibuprofen (ADVIL,MOTRIN) 800 MG tablet Take 1 tablet (800 mg total) by mouth every 8 (eight) hours as needed. 03/10/13   Ena Dawley, MD  meclizine (ANTIVERT) 12.5 MG tablet Take 1 tablet (12.5 mg total) by mouth 3 (three) times daily as needed for dizziness. 12/07/17   Raylene Everts, MD  Multiple Vitamins-Minerals (MULTIVITAMIN WITH MINERALS) tablet Take 1 tablet by mouth daily.    [provider]    Allergies    Milk-related compounds  Review of Systems   Review of Systems  Constitutional:  Negative for fever.  HENT:  Negative for ear pain.   Eyes:  Negative for pain.  Respiratory:  Negative for cough.   Cardiovascular:  Negative for chest pain.  Gastrointestinal:  Negative for abdominal pain.  Genitourinary:  Negative for flank pain.  Musculoskeletal:  Negative for back pain.  Skin:  Negative for rash.  Neurological:  Negative for headaches.   Physical Exam Updated Vital Signs BP (!) 156/85   Pulse 75   Temp 98.5 F (36.9 C) (Oral)   Resp 11   SpO2 100%   Physical Exam Constitutional:      General: She is not in acute distress.    Appearance: Normal appearance.  HENT:     Head: Normocephalic.     Nose: Nose normal.  Eyes:     Extraocular Movements: Extraocular movements intact.  Cardiovascular:     Rate and Rhythm: Normal rate.  Pulmonary:     Effort: Pulmonary effort is normal.  Musculoskeletal:        General: Normal range of motion.     Cervical back: Normal range of motion.  Neurological:     General: No focal deficit present.     Mental Status: She is alert. Mental status is at baseline.    ED Results / Procedures / Treatments   Labs (all labs ordered are listed, but  only abnormal results are displayed) Labs Reviewed  BASIC METABOLIC PANEL - Abnormal; Notable for the following  components:      Result Value   Glucose, Bld 104 (*)    Anion gap 4 (*)    All other components within normal limits  CBC WITH DIFFERENTIAL/PLATELET - Abnormal; Notable for the following components:   Hemoglobin 11.8 (*)    All other components within normal limits  CBG MONITORING, ED  TROPONIN I (HIGH SENSITIVITY)  TROPONIN I (HIGH SENSITIVITY)    EKG EKG Interpretation  Date/Time:  Thursday August 01 2020 17:14:15 EDT Ventricular Rate:  82 PR Interval:  146 QRS Duration: 74 QT Interval:  342 QTC Calculation: 399 R Axis:   36 Text Interpretation: Normal sinus rhythm with sinus arrhythmia Nonspecific T wave abnormality Abnormal ECG Confirmed by Thamas Jaegers ([id number]) on 08/02/2020 7:02:11 AM  Radiology DG Chest 2 View  Result Date: 08/01/2020 CLINICAL DATA:  Heart palpitations intermittently, history diabetes mellitus EXAM: CHEST - 2 VIEW COMPARISON:  06/07/2006 FINDINGS: Normal heart size, mediastinal contours, and pulmonary vascularity. Lungs clear. No pleural effusion or pneumothorax. BILATERAL cervical ribs, larger on LEFT. IMPRESSION: No acute abnormalities. Electronically Signed   By: Lavonia Dana M.D.   On: 08/01/2020 18:44    Procedures Procedures   Medications Ordered in ED Medications - No data to display  ED Course  I have reviewed the triage vital signs and the nursing notes.  Pertinent labs & imaging results that were available during my care of the patient were reviewed by me and considered in my medical decision making (see chart for details).    MDM Rules/Calculators/A&P                           Patient currently remains back at baseline with no symptoms.  Initial vital signs within normal limits.  Labs are otherwise unremarkable white count normal chemistry normal troponin negative.  EKG shows sinus rhythm no ST elevations depressions normal rate normal rhythm. Will advise patient to follow-up with cardiology on an outpatient basis.  Advising immediate  return for recurrent or persistent symptoms or if she has any additional concerns return immediately back to the ER.  Final Clinical Impression(s) / ED Diagnoses Final diagnoses:  Palpitations    Rx / DC Orders ED Discharge Orders     None        Luna Fuse, MD 08/02/20 [phone]

## 2020-08-02 NOTE — Discharge Instructions (Addendum)
Call your primary care doctor or specialist as discussed in the next 2-3 days.   Return immediately back to the ER if:  Your symptoms worsen within the next 12-24 hours. You develop new symptoms such as new fevers, persistent vomiting, new pain, shortness of breath, or new weakness or numbness, or if you have any other concerns.  

## 2020-08-02 NOTE — ED Notes (Signed)
Patient verbalizes understanding of discharge instructions. Opportunity for questioning and answers were provided. Pt discharged from ED. 

## 2020-08-14 ENCOUNTER — Other Ambulatory Visit: Payer: Self-pay

## 2020-08-14 ENCOUNTER — Ambulatory Visit
Admission: RE | Admit: 2020-08-14 | Discharge: 2020-08-14 | Disposition: A | Discharge status: Home / Self Care | Payer: BC Managed Care – PPO | Source: Ambulatory Visit | Attending: Family Medicine | Admitting: Family Medicine

## 2020-08-14 DIAGNOSIS — Z1231 Encounter for screening mammogram for malignant neoplasm of breast: Secondary | ICD-10-CM

## 2020-08-14 IMAGING — MG MM DIGITAL SCREENING BILAT W/ TOMO AND CAD
6 of 10 series · 6 of 30 positions shown · non-contrast
Comparison: Previous exam(s).

CLINICAL DATA: Screening.

EXAM:
DIGITAL SCREENING BILATERAL MAMMOGRAM WITH TOMOSYNTHESIS AND CAD
TECHNIQUE: Bilateral screening digital craniocaudal and mediolateral oblique
mammograms were obtained. Bilateral screening digital breast
tomosynthesis was performed. The images were evaluated with
computer-aided detection.

[L CC synth-2D]
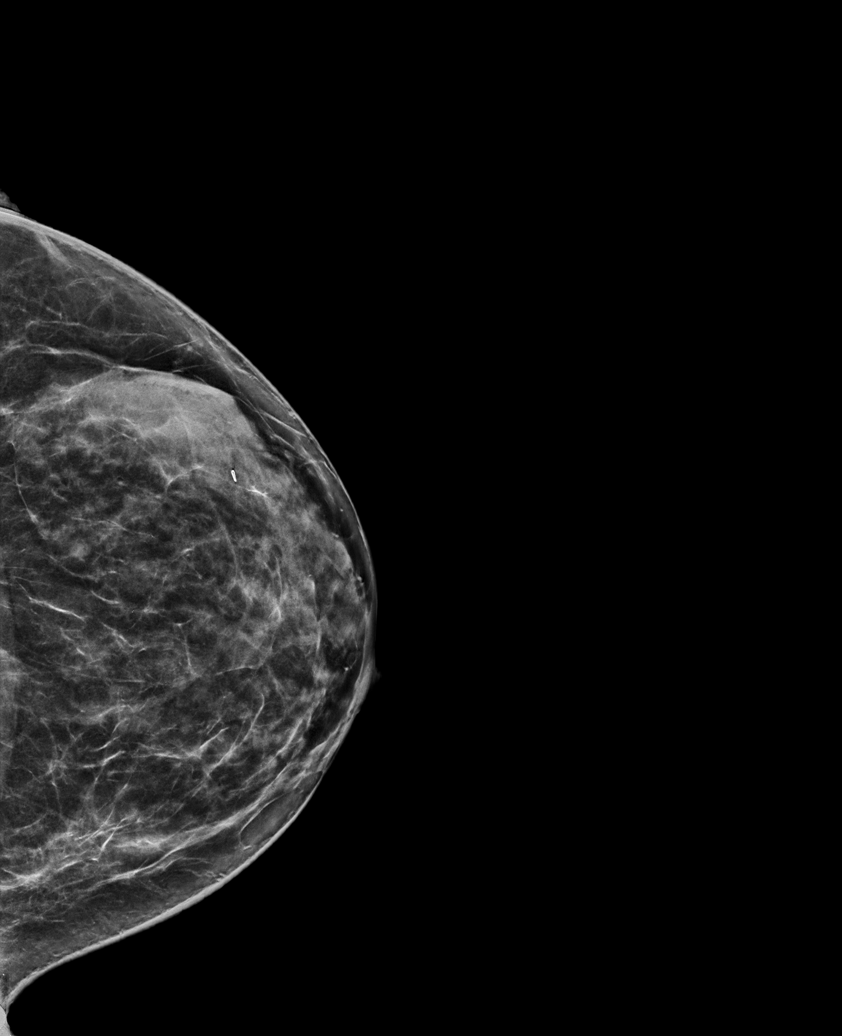

[L MLO synth-2D (1 of 2)]
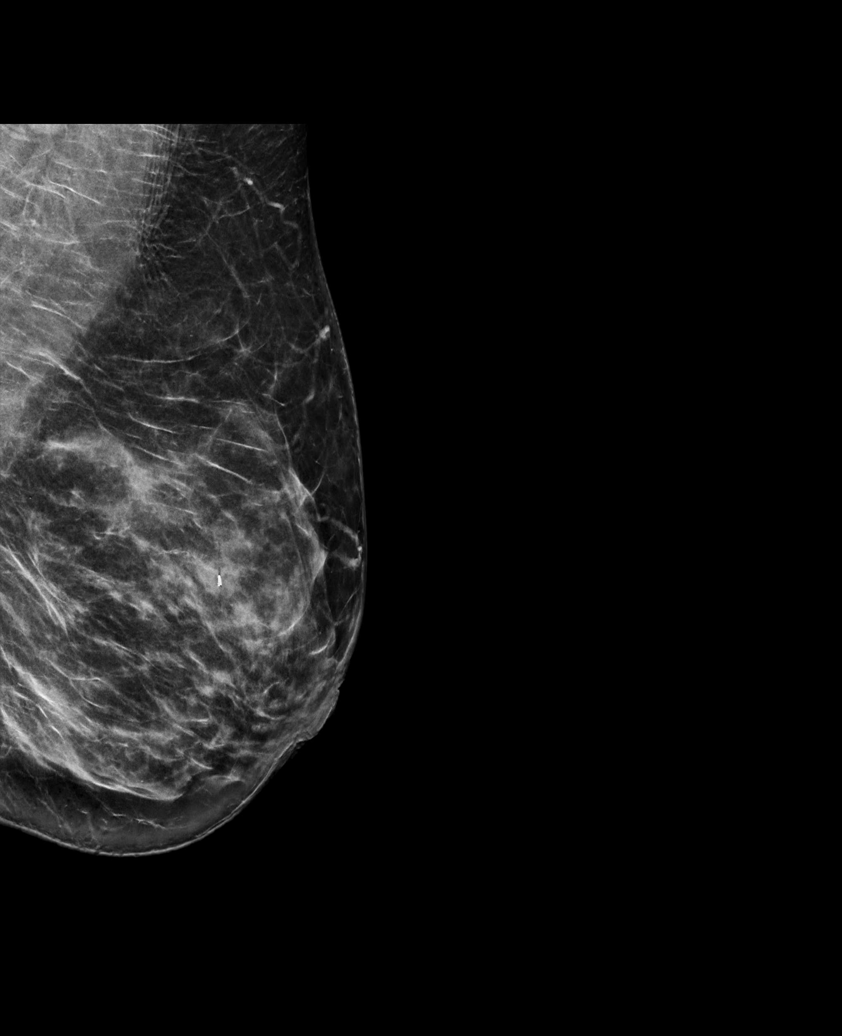

[L MLO synth-2D (2 of 2)]
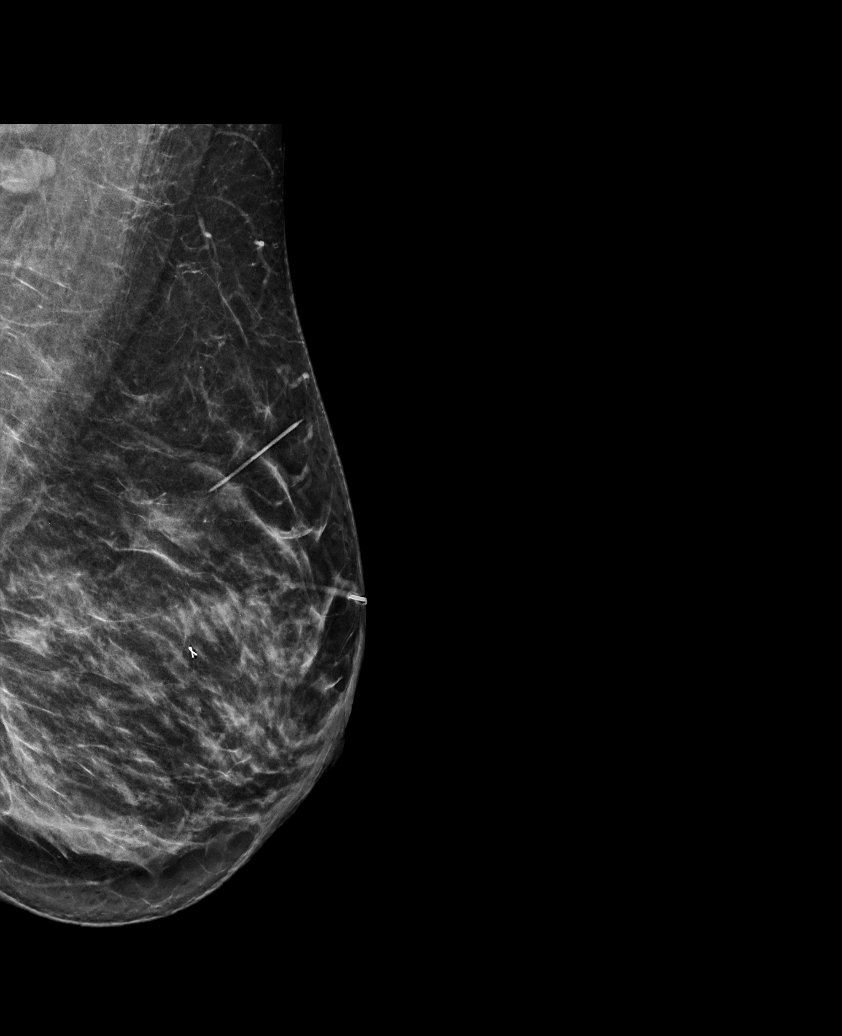

[R CC synth-2D]
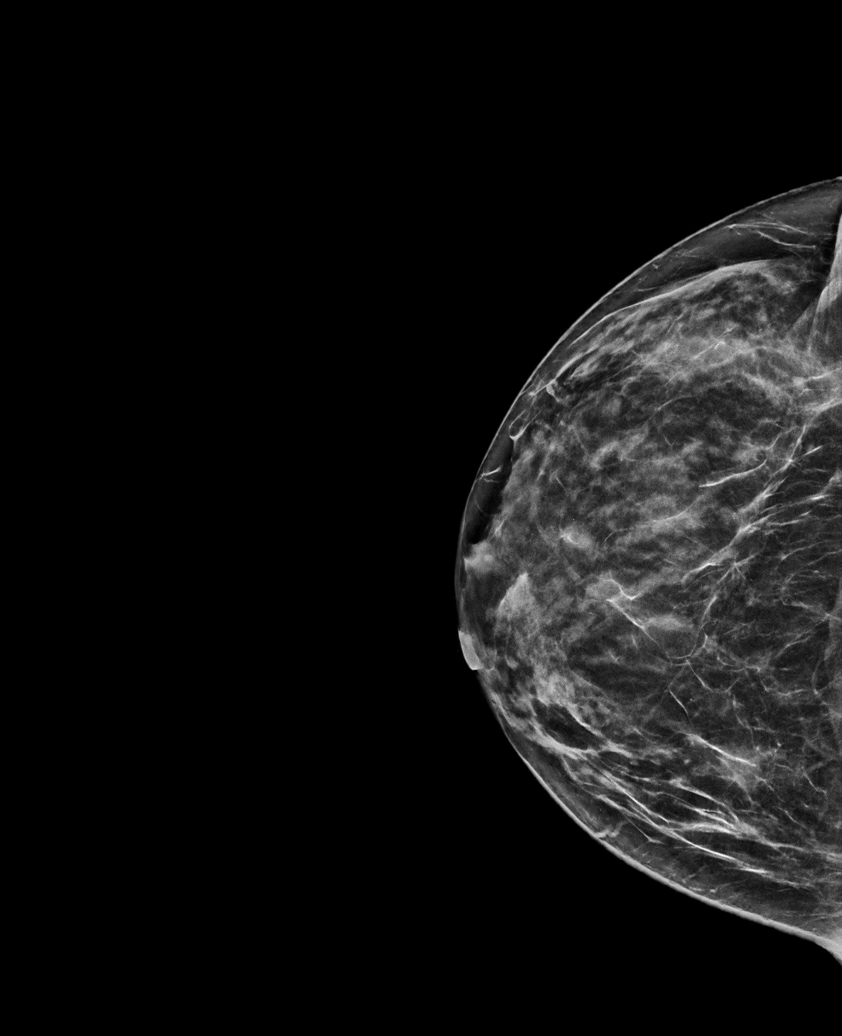

[R MLO synth-2D]
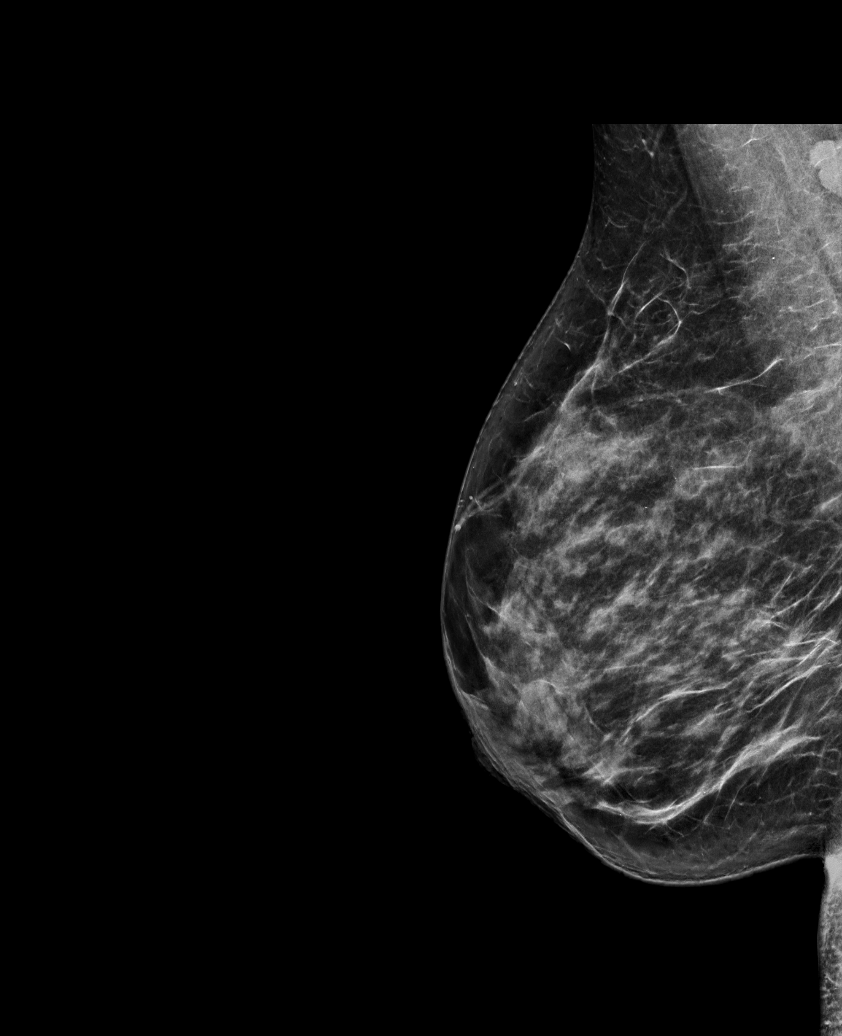

[L MLO tomo · tomo slice 33/66.0]
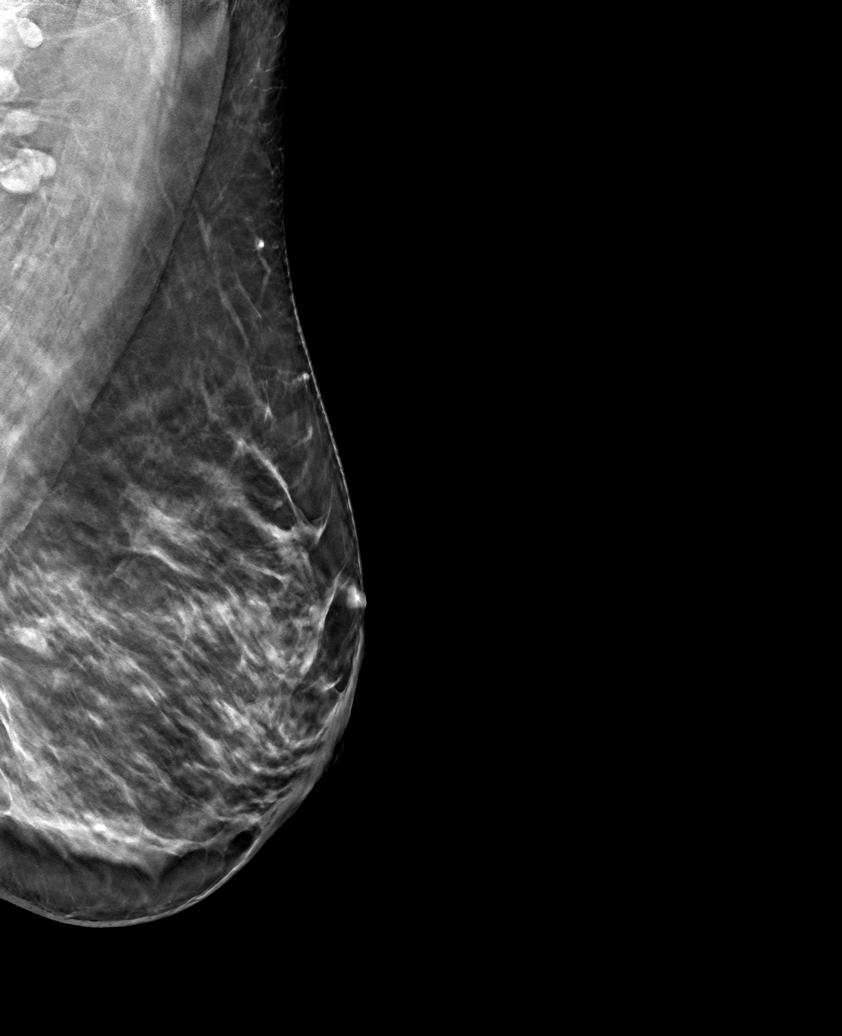

[6 of 30 positions shown; findings below may reference images not displayed]

ACR Breast Density Category c: The breast tissue is heterogeneously
dense, which may obscure small masses.
FINDINGS: There are no findings suspicious for malignancy.
IMPRESSION: No mammographic evidence of malignancy. A result letter of this
screening mammogram will be mailed directly to the patient.

RECOMMENDATION:
Screening mammogram in one year. (Code:Q3-W-BC3)

BI-RADS CATEGORY  1: Negative.

## 2020-08-18 DIAGNOSIS — R002 Palpitations: Secondary | ICD-10-CM | POA: Insufficient documentation

## 2020-08-18 NOTE — Progress Notes (Addendum)
Patient referred by Donald Prose, MD for palpitations  Subjective:   Susan Wilkerson, female    DOB: 25-Nov-1970, 50 y.o.   MRN: [id number]   Chief Complaint  Patient presents with   Palpitations   New Patient (Initial Visit)    Referred by Theodoro Doing, MD     HPI  50 y.o. African American female with hypertension, type 2 diabetes mellitus, referred for palpitations  Patient works as an Medical illustrator.  She stays active and with walking at work and outside of, walks about 8,000-10,000 steps every day.  She denies any exertional chest pain or shortness of breath symptoms.  Last week, patient had 2 episodes of sudden onset palpitations while at rest, associated with lightheadedness or shortness of breath lasting for 3 minutes, resolving on their own.  Patient does not drink alcohol, does endorse drinking teas and occasional coffee.   Past Medical History:  Diagnosis Date   Breakthrough bleeding on birth control pills 06/02/2006   history   Diabetes mellitus without complication ([place])    tx - enalapril type II   Endometrial polyp 09/30/2009   Glucosuria 08/30/07   H/O amenorrhea 2001   H/O dysmenorrhea 2005   H/O herpes simplex type 2 infection    H/O vaginal discharge 2010   Headache(784.0)    otc med prn   Hx gestational diabetes 2000   GDM with pregnancy, after delivery it did not go away   Hx: UTI (urinary tract infection) 1997   Irregular periods/menstrual cycles 2011   Lactose intolerance    Spotting 2011   history   SVD (spontaneous vaginal delivery)    x 1   Varicose veins      Past Surgical History:  Procedure Laterality Date   ABDOMINAL HYSTERECTOMY N/A 03/09/2013   Procedure: laporoscopic vaginal hysterectomy;  Surgeon: Ena Dawley, MD;  Location: [facility];  Service: Gynecology;  Laterality: N/A;   BREAST BIOPSY  06/22/2006   BREAST EXCISIONAL BIOPSY Left 2008   benign   BREAST EXCISIONAL BIOPSY Left 1990   benign   BREAST SURGERY      right side x 3   CHOLECYSTECTOMY  2005   DILATION AND CURETTAGE OF UTERUS     polyp   LUMP REMOVED FROM RIGHT BREAST  1990   WISDOM TOOTH EXTRACTION       Social History   Tobacco Use  Smoking Status Never  Smokeless Tobacco Never    Social History   Substance and Sexual Activity  Alcohol Use No     Family History  Problem Relation Age of Onset   Hypertension Mother    Diabetes Mother    Cancer Mother        Uterine   Diabetes Father    Cancer Father        Mouth , lung , & stomach   Alcohol abuse Father    Hypertension Sister    Diabetes Sister        Four sisters  - all diabetic   Learning disabilities Sister        Schizophrenia   Drug abuse Sister        Cocaine   Breast cancer Sister    Cancer Paternal Aunt        Stomach & lung   Cancer Paternal Uncle        Lung   Diabetes Maternal Grandmother    Breast cancer Cousin      Current Outpatient Medications on File Prior  to Visit  Medication Sig Dispense Refill   Biotin 5000 MCG TABS Take by mouth every other day.     cholecalciferol (VITAMIN D) 1000 UNITS tablet Take 1,000 Units by mouth daily as needed.      docusate sodium (COLACE) 100 MG capsule Take 1 capsule (100 mg total) by mouth 2 (two) times daily. 60 capsule 2   enalapril (VASOTEC) 5 MG tablet Take 5 mg by mouth daily.     ferrous sulfate (FERROUSUL) 325 (65 FE) MG tablet Take 1 tablet (325 mg total) by mouth 2 (two) times daily with a meal. 100 tablet 1   fluticasone (FLONASE) 50 MCG/ACT nasal spray Place 1 spray into both nostrils daily. 16 g 0   ibuprofen (ADVIL,MOTRIN) 800 MG tablet Take 1 tablet (800 mg total) by mouth every 8 (eight) hours as needed. 50 tablet 1   meclizine (ANTIVERT) 12.5 MG tablet Take 1 tablet (12.5 mg total) by mouth 3 (three) times daily as needed for dizziness. 30 tablet 0   Multiple Vitamins-Minerals (MULTIVITAMIN WITH MINERALS) tablet Take 1 tablet by mouth daily.     No current facility-administered medications  on file prior to visit.    Cardiovascular and other pertinent studies:  EKG 08/19/2020: Sinus rhythm 92 bpm Normal EKG   Recent labs: 08/01/2020: Glucose 104, BUN/Cr 11/0.82. EGFR >60. Na/K 136/3.7.  Trop HS <2,  H/H 11.8/37.4. MCV 86. Platelets 271 HbA1C 6.3% Chol 167, TG 66, HDL 57, LDL 97 TSH 1.6 normal   Review of Systems  Cardiovascular:  Positive for palpitations (associated with lightheadedness/palpitations). Negative for chest pain, dyspnea on exertion, leg swelling and syncope.        Vitals:   08/19/20 0846  BP: (!) 148/86  Pulse: (!) 105  Resp: 16  Temp: 98.5 F (36.9 C)  SpO2: 97%     Body mass index is 29.26 kg/m. Filed Weights   08/19/20 0846  Weight: 160 lb (72.6 kg)     Objective:   Physical Exam Vitals and nursing note reviewed.  Constitutional:      General: She is not in acute distress. Neck:     Vascular: No JVD.  Cardiovascular:     Rate and Rhythm: Normal rate and regular rhythm.     Heart sounds: Normal heart sounds. No murmur heard. Pulmonary:     Effort: Pulmonary effort is normal.     Breath sounds: Normal breath sounds. No wheezing or rales.        Assessment & Recommendations:    50 y.o. African American female with hypertension, type 2 diabetes mellitus, referred for palpitations  Palpitations: By clinical history, possibly SVT or atrial flutter. Discussed vagal maneuvers.  In addition, prescribed diltiazem for as needed use 30 mg every 8 hours as needed. Given history of hypertension, will obtain echocardiogram, along with 2-week cardiac telemetry.  Hypertension: Blood pressure and heart rate both elevated today, possibly due to anxiety.  Recommend home monitoring. Blood pressure remains elevated, daily beta-blocker for daily use.  Patient would like to hold off at this point, possible.  Further recommendations after above testing.   Thank you for referring the patient to [place]. Please feel free to contact with any  questions.   Nigel Mormon, MD Pager: [phone] Office: [phone]

## 2020-08-19 ENCOUNTER — Inpatient Hospital Stay: Payer: BC Managed Care – PPO

## 2020-08-19 ENCOUNTER — Ambulatory Visit: Payer: BC Managed Care – PPO | Admitting: Cardiology

## 2020-08-19 ENCOUNTER — Encounter: Payer: Self-pay | Admitting: Cardiology

## 2020-08-19 ENCOUNTER — Other Ambulatory Visit: Payer: Self-pay

## 2020-08-19 VITALS — BP 148/86 | HR 105 | Temp 98.5°F | Resp 16 | Ht 62.0 in | Wt 160.0 lb

## 2020-08-19 DIAGNOSIS — R002 Palpitations: Secondary | ICD-10-CM

## 2020-08-19 DIAGNOSIS — I10 Essential (primary) hypertension: Secondary | ICD-10-CM | POA: Insufficient documentation

## 2020-08-19 MED ORDER — DILTIAZEM HCL 30 MG PO TABS: 30.0000 mg | ORAL_TABLET | Freq: Four times a day (QID) | ORAL | 2 refills | Status: AC

## 2020-08-29 ENCOUNTER — Telehealth: Payer: Self-pay

## 2020-09-03 ENCOUNTER — Encounter: Payer: Self-pay | Admitting: Student

## 2020-09-05 ENCOUNTER — Ambulatory Visit: Payer: BC Managed Care – PPO

## 2020-09-05 ENCOUNTER — Other Ambulatory Visit: Payer: Self-pay

## 2020-09-05 DIAGNOSIS — R002 Palpitations: Secondary | ICD-10-CM

## 2020-09-05 DIAGNOSIS — I10 Essential (primary) hypertension: Secondary | ICD-10-CM

## 2020-09-18 ENCOUNTER — Encounter (HOSPITAL_COMMUNITY): Payer: Self-pay | Admitting: Emergency Medicine

## 2020-09-18 ENCOUNTER — Other Ambulatory Visit: Payer: Self-pay

## 2020-09-18 ENCOUNTER — Emergency Department (HOSPITAL_COMMUNITY): Payer: BC Managed Care – PPO

## 2020-09-18 ENCOUNTER — Ambulatory Visit (HOSPITAL_COMMUNITY)
Admission: EM | Admit: 2020-09-18 | Discharge: 2020-09-18 | Disposition: A | Discharge status: Home / Self Care | Payer: BC Managed Care – PPO

## 2020-09-18 ENCOUNTER — Emergency Department (HOSPITAL_COMMUNITY)
Admission: EM | Admit: 2020-09-18 | Discharge: 2020-09-19 | Disposition: A | Discharge status: Home / Self Care | Payer: BC Managed Care – PPO | Attending: Emergency Medicine | Admitting: Emergency Medicine

## 2020-09-18 DIAGNOSIS — U071 COVID-19: Secondary | ICD-10-CM | POA: Diagnosis not present

## 2020-09-18 DIAGNOSIS — Z79899 Other long term (current) drug therapy: Secondary | ICD-10-CM | POA: Diagnosis not present

## 2020-09-18 DIAGNOSIS — E119 Type 2 diabetes mellitus without complications: Secondary | ICD-10-CM | POA: Diagnosis not present

## 2020-09-18 DIAGNOSIS — I10 Essential (primary) hypertension: Secondary | ICD-10-CM | POA: Diagnosis not present

## 2020-09-18 DIAGNOSIS — R0602 Shortness of breath: Secondary | ICD-10-CM

## 2020-09-18 DIAGNOSIS — Z7984 Long term (current) use of oral hypoglycemic drugs: Secondary | ICD-10-CM | POA: Insufficient documentation

## 2020-09-18 DIAGNOSIS — R002 Palpitations: Secondary | ICD-10-CM | POA: Diagnosis not present

## 2020-09-18 LAB — D-DIMER, QUANTITATIVE: D-Dimer, Quant: 0.42 ug/mL-FEU (ref 0.00–0.50)

## 2020-09-18 LAB — BASIC METABOLIC PANEL
Anion gap: 9 (ref 5–15)
BUN: 9 mg/dL (ref 6–20)
CO2: 27 mmol/L (ref 22–32)
Calcium: 9.4 mg/dL (ref 8.9–10.3)
Chloride: 101 mmol/L (ref 98–111)
Creatinine, Ser: 0.71 mg/dL (ref 0.44–1.00)
GFR, Estimated: >60 mL/min (ref >60)
Glucose, Bld: 125 mg/dL — ABNORMAL HIGH (ref 70–99)
Potassium: 3.8 mmol/L (ref 3.5–5.1)
Sodium: 137 mmol/L (ref 135–145)

## 2020-09-18 LAB — CBC WITH DIFFERENTIAL/PLATELET
Abs Immature Granulocytes: 0.02 10*3/uL (ref 0.00–0.07)
Basophils Absolute: 0 10*3/uL (ref 0.0–0.1)
Basophils Relative: 0 %
Eosinophils Absolute: 0 10*3/uL (ref 0.0–0.5)
Eosinophils Relative: 1 %
HCT: 39.4 % (ref 36.0–46.0)
Hemoglobin: 12.3 g/dL (ref 12.0–15.0)
Immature Granulocytes: 0 %
Lymphocytes Relative: 41 %
Lymphs Abs: 2.8 10*3/uL (ref 0.7–4.0)
MCH: 27.1 pg (ref 26.0–34.0)
MCHC: 31.2 g/dL (ref 30.0–36.0)
MCV: 86.8 fL (ref 80.0–100.0)
Monocytes Absolute: 0.4 10*3/uL (ref 0.1–1.0)
Monocytes Relative: 6 %
Neutro Abs: 3.5 10*3/uL (ref 1.7–7.7)
Neutrophils Relative %: 52 %
Platelets: 302 10*3/uL (ref 150–400)
RBC: 4.54 MIL/uL (ref 3.87–5.11)
RDW: 13.2 % (ref 11.5–15.5)
WBC: 6.8 10*3/uL (ref 4.0–10.5)
nRBC: 0 % (ref 0.0–0.2)

## 2020-09-18 LAB — BRAIN NATRIURETIC PEPTIDE: B Natriuretic Peptide: 8.3 pg/mL (ref 0.0–100.0)

## 2020-09-18 LAB — CBG MONITORING, ED: Glucose-Capillary: 134 mg/dL — ABNORMAL HIGH (ref 70–99)

## 2020-09-18 IMAGING — CR DG CHEST 1V
1 series · 1 of 1 positions shown · non-contrast
Comparison: Chest x-ray 08/01/2020

CLINICAL DATA: Shortness of breath.  COVID.

EXAM:
CHEST  1 VIEW

[chest pa]
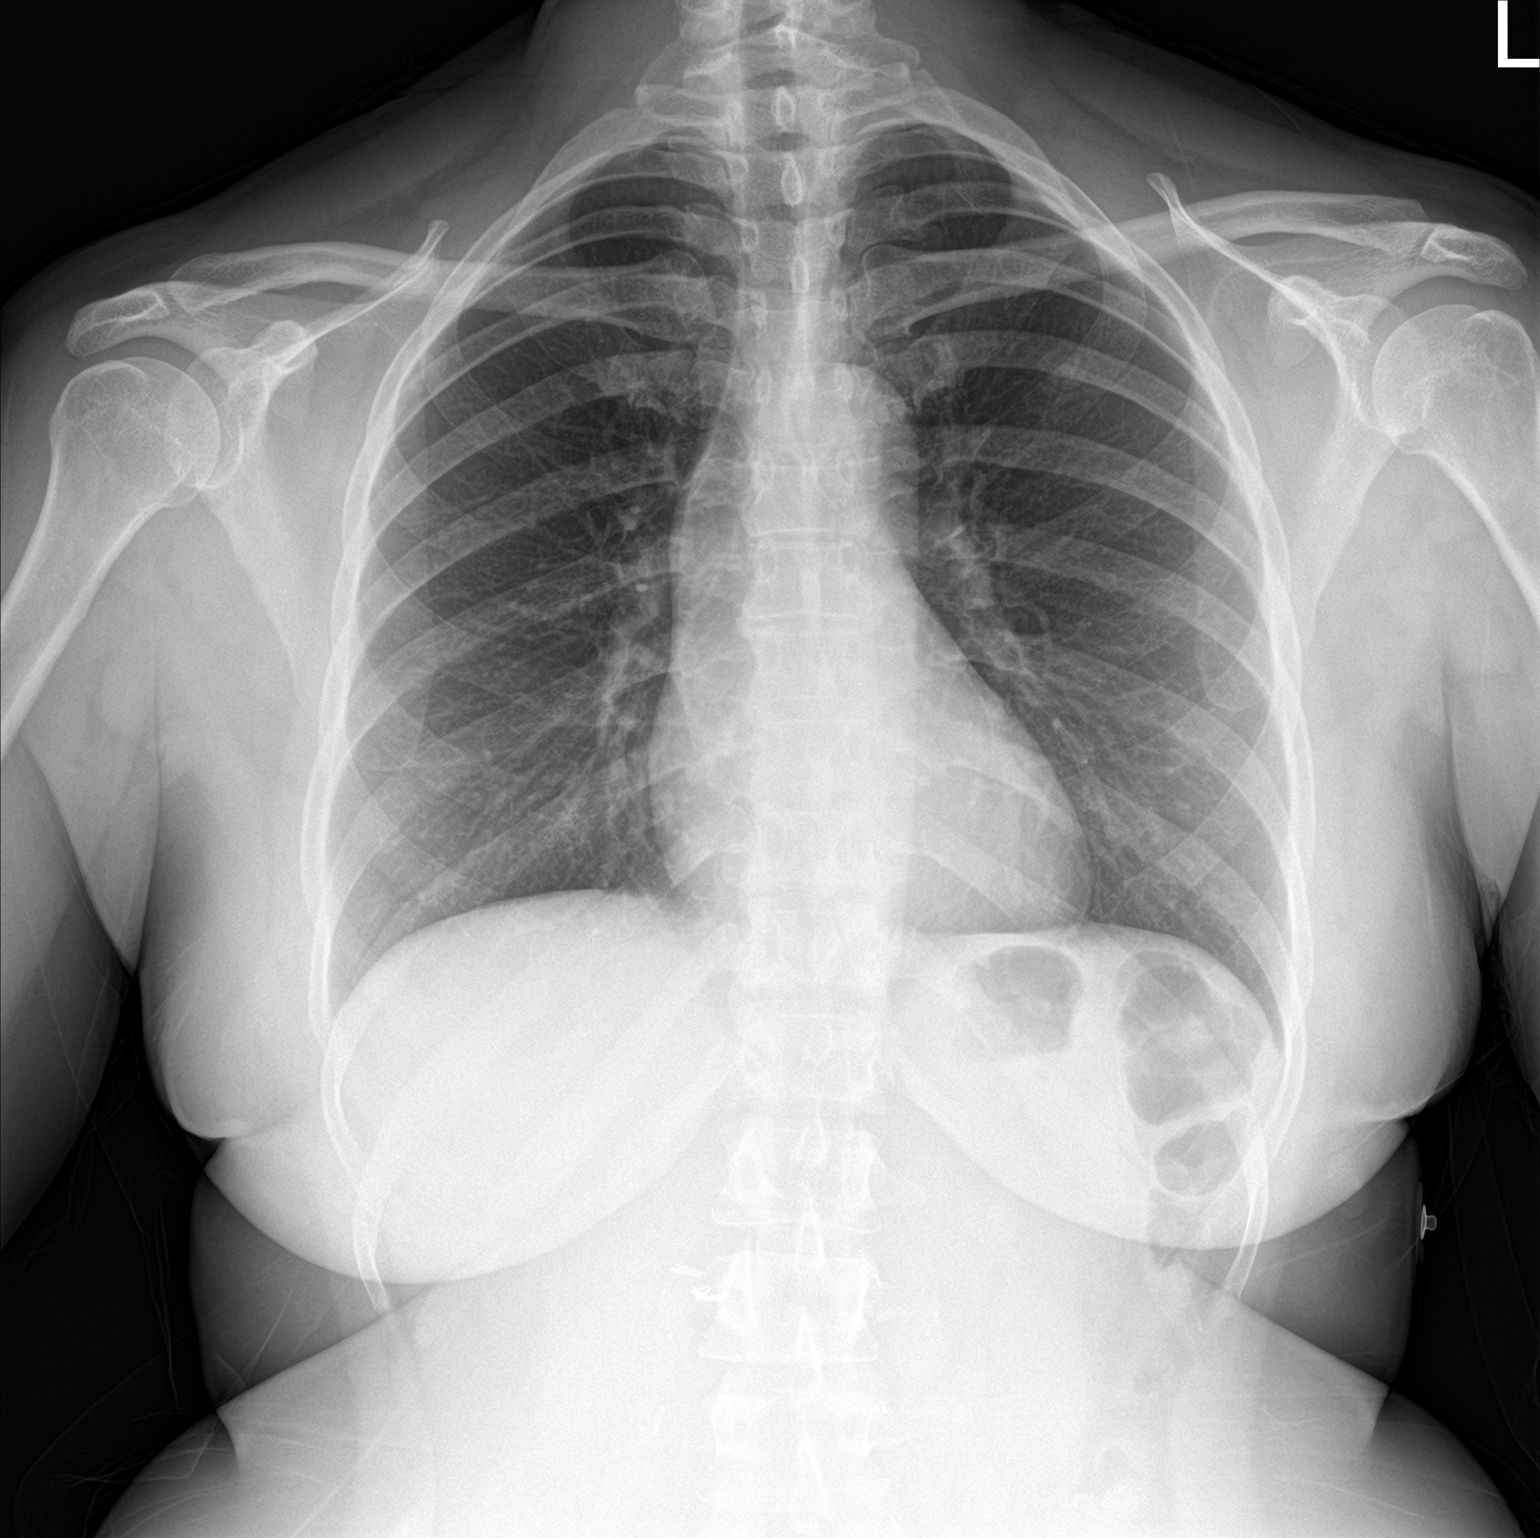

[1 of 1 positions shown; findings below may reference images not displayed]

FINDINGS: The heart size and mediastinal contours are within normal limits.
Both lungs are clear. The visualized skeletal structures are
unremarkable. There are surgical clips in the right abdomen.
IMPRESSION: No active disease.

## 2020-09-18 NOTE — ED Notes (Signed)
Patient is being discharged from the Urgent Care and sent to the Emergency Department via POV . Per Ewell Poe, patient is in need of higher level of care due to patient recent diagnoses of Covid feeling SOB and having palpitations. Patient is aware and verbalizes understanding of plan of care.  Vitals:   09/18/20 1411  BP: 138/89  Pulse: 99  Resp: 18  Temp: 98 F (36.7 C)  SpO2: 99%

## 2020-09-18 NOTE — ED Provider Notes (Signed)
[facility] EMERGENCY DEPARTMENT Provider Note  CSN: [id number] Arrival date & time: 09/18/20 1444  Chief Complaint(s) No chief complaint on file.  HPI Susan Wilkerson is a 50 y.o. female who tested +COVID 7days ago and received Paxlovid is here for 2 days of intermittent SOB. Worse with speaking. Had right upper aching chest pain for 15 minutes last night.  Has been chest pain-free since then. Had cough that resolved. No emesis. No leg swelling. No prior history of DVT/PE.  Patient is not on OCP.  Seen at [facility] and sent here to rule out PE.  The history is provided by the patient.   Past Medical History Past Medical History:  Diagnosis Date   Breakthrough bleeding on birth control pills 06/02/2006   history   Diabetes mellitus without complication ([place])    tx - enalapril type II   Endometrial polyp 09/30/2009   Glucosuria 08/30/07   H/O amenorrhea 2001   H/O dysmenorrhea 2005   H/O herpes simplex type 2 infection    H/O vaginal discharge 2010   Headache(784.0)    otc med prn   Hx gestational diabetes 2000   GDM with pregnancy, after delivery it did not go away   Hx: UTI (urinary tract infection) 1997   Irregular periods/menstrual cycles 2011   Lactose intolerance    Spotting 2011   history   SVD (spontaneous vaginal delivery)    x 1   Varicose veins    Patient Active Problem List   Diagnosis Date Noted   Essential hypertension 08/19/2020   Palpitations 08/18/2020   Fibroids 03/09/2013   S/P laparoscopic assisted vaginal hysterectomy (LAVH) 03/09/2013   Fibroid 08/24/2011   Home Medication(s) Prior to Admission medications   Medication Sig Start Date End Date Taking? Authorizing Provider  atorvastatin (LIPITOR) 10 MG tablet Take 10 mg by mouth once a week. 05/31/20   [provider]  Biotin 10000 MCG TABS Take by mouth every other day.    [provider]  cholecalciferol (VITAMIN D) 1000 UNITS tablet Take 1,000 Units by mouth  daily as needed.     [provider]  diltiazem (CARDIZEM) 30 MG tablet Take 1 tablet (30 mg total) by mouth 4 (four) times daily. 08/19/20   Patwardhan, Manish J, MD  enalapril (VASOTEC) 5 MG tablet Take 5 mg by mouth daily.    [provider]  metFORMIN (GLUCOPHAGE) 500 MG tablet Take 500 mg by mouth 2 (two) times daily. 04/16/20   [provider]  Multiple Vitamins-Minerals (MULTIVITAMIN WITH MINERALS) tablet Take 1 tablet by mouth daily.    [provider]  vitamin B-12 (CYANOCOBALAMIN) 1000 MCG tablet Take 1,000 mcg by mouth daily.    [provider]  Past Surgical History Past Surgical History:  Procedure Laterality Date   ABDOMINAL HYSTERECTOMY N/A 03/09/2013   Procedure: laporoscopic vaginal hysterectomy;  Surgeon: Ena Dawley, MD;  Location: [facility];  Service: Gynecology;  Laterality: N/A;   BREAST BIOPSY  06/22/2006   BREAST EXCISIONAL BIOPSY Left 2008   benign   BREAST EXCISIONAL BIOPSY Left 1990   benign   BREAST SURGERY     right side x 3   CHOLECYSTECTOMY  2005   DILATION AND CURETTAGE OF UTERUS     polyp   LUMP REMOVED FROM RIGHT BREAST  1990   WISDOM TOOTH EXTRACTION     Family History Family History  Problem Relation Age of Onset   Hypertension Mother    Diabetes Mother    Cancer Mother        Uterine   Diabetes Father    Cancer Father        Mouth , lung , & stomach   Alcohol abuse Father    Cancer Sister    Hypertension Sister    Diabetes Sister        Four sisters  - all diabetic   Learning disabilities Sister        Schizophrenia   Drug abuse Sister        Cocaine   Breast cancer Sister    Drug abuse Sister    Cancer Sister    Diabetes Sister    Cancer Sister    Diabetes Sister    Asthma Sister    Diabetes Sister    Schizophrenia Sister    Drug abuse Sister    Aneurysm  Sister    Cancer Paternal Aunt        Stomach & lung   Cancer Paternal Uncle        Lung   Diabetes Maternal Grandmother    Breast cancer Cousin     Social History Social History   Tobacco Use   Smoking status: Never   Smokeless tobacco: Never  Substance Use Topics   Alcohol use: No   Drug use: No   Allergies Milk-related compounds  Review of Systems Review of Systems All other systems are reviewed and are negative for acute change except as noted in the HPI  Physical Exam Vital Signs  I have reviewed the triage vital signs BP (!) 141/85 (BP Location: Right Arm)   Pulse 82   Temp 98.6 F (37 C) (Oral)   Resp 15   LMP 04/06/2013   SpO2 100%    Physical Exam Vitals reviewed.  Constitutional:      General: She is not in acute distress.    Appearance: She is well-developed. She is not diaphoretic.  HENT:     Head: Normocephalic and atraumatic.     Nose: Nose normal.  Eyes:     General: No scleral icterus.       Right eye: No discharge.        Left eye: No discharge.     Conjunctiva/sclera: Conjunctivae normal.     Pupils: Pupils are equal, round, and reactive to light.  Cardiovascular:     Rate and Rhythm: Normal rate and regular rhythm.     Heart sounds: No murmur heard.   No friction rub. No gallop.  Pulmonary:     Effort: Pulmonary effort is normal. No respiratory distress.     Breath sounds: Normal breath sounds. No stridor. No rales.  Abdominal:     General: There is no distension.  Palpations: Abdomen is soft.     Tenderness: There is no abdominal tenderness.  Musculoskeletal:        General: No tenderness.     Cervical back: Normal range of motion and neck supple.     Right lower leg: No edema.     Left lower leg: No edema.  Skin:    General: Skin is warm and dry.     Findings: No erythema or rash.  Neurological:     Mental Status: She is alert and oriented to person, place, and time.    ED Results and Treatments Labs (all labs  ordered are listed, but only abnormal results are displayed) Labs Reviewed  BASIC METABOLIC PANEL - Abnormal; Notable for the following components:      Result Value   Glucose, Bld 125 (*)    All other components within normal limits  CBC WITH DIFFERENTIAL/PLATELET  BRAIN NATRIURETIC PEPTIDE  D-DIMER, QUANTITATIVE  CBG MONITORING, ED                                                                                                                         EKG  EKG Interpretation  Date/Time:  Wednesday September 18 2020 23:22:02 EDT Ventricular Rate:  79 PR Interval:  194 QRS Duration: 83 QT Interval:  355 QTC Calculation: 407 R Axis:   74 Text Interpretation: Sinus rhythm Consider left atrial enlargement Consider left ventricular hypertrophy lead reversal corrected. No acute changes Confirmed by Addison Lank [phone]) on 09/18/2020 11:35:06 PM       Radiology DG Chest 1 View  Result Date: 09/18/2020 CLINICAL DATA:  Shortness of breath.  COVID. EXAM: CHEST  1 VIEW COMPARISON:  Chest x-ray 08/01/2020 FINDINGS: The heart size and mediastinal contours are within normal limits. Both lungs are clear. The visualized skeletal structures are unremarkable. There are surgical clips in the right abdomen. IMPRESSION: No active disease. Electronically Signed   By: Ronney Asters M.D.   On: 09/18/2020 18:17    Pertinent labs & imaging results that were available during my care of the patient were reviewed by me and considered in my medical decision making (see MDM for details).  Medications Ordered in ED Medications - No data to display  Procedures Procedures  (including critical care time)  Medical Decision Making / ED Course I have reviewed the nursing notes for this encounter and the patient's prior records (if available in EHR or on provided paperwork).  Susan Wilkerson was evaluated in Emergency Department on 09/18/2020 for the symptoms described in the history of present illness. She was evaluated in the context of the global COVID-19 pandemic, which necessitated consideration that the patient might be at risk for infection with the SARS-CoV-2 virus that causes COVID-19. Institutional protocols and algorithms that pertain to the evaluation of patients at risk for COVID-19 are in a state of rapid change based on information released by regulatory bodies including the CDC and federal and state organizations. These policies and algorithms were followed during the patient's care in the ED.     Patient presents for shortness of breath to rule out PE. In the setting of recent COVID infection and Paxlovid use. Patient is well-appearing, well-hydrated, nontoxic. She is afebrile with stable vital signs. In no respiratory distress. Lungs are clear to auscultation bilaterally. No evidence of volume overload on exam. No unilateral leg swelling.  Screening labs, chest x-ray, and EKG appropriately ordered in the MSE process  Pertinent labs & imaging results that were available during my care of the patient were reviewed by me and considered in my medical decision making:  CBC without leukocytosis or anemia No significant electrolyte derangements or renal sufficiency. BNP within normal limits -not suggestive of heart failure. Dimer negative.  Low suspicion for pulmonary embolism.  Chest x-ray without evidence suggestive of pneumonia, pneumothorax, pneumomediastinum.  No abnormal contour of the mediastinum to suggest dissection. No evidence of acute injuries.  EKG without acute ischemic changes, dysrhythmias, blocks. no evidence of pericarditis.  Doubt ACS.  Final Clinical Impression(s) / ED Diagnoses Final diagnoses:  None   The patient appears reasonably screened and/or stabilized for discharge and I doubt any other medical condition or other [facility]  requiring further screening, evaluation, or treatment in the ED at this time prior to discharge. Safe for discharge with strict return precautions.  Disposition: Discharge  Condition: Good  I have discussed the results, Dx and Tx plan with the patient/family who expressed understanding and agree(s) with the plan. Discharge instructions discussed at length. The patient/family was given strict return precautions who verbalized understanding of the instructions. No further questions at time of discharge.    ED Discharge Orders     None       Follow Up: Donald Prose, MD [address] [place] [address] [phone]  Call  to schedule an appointment for close follow up in 5-7 days, if symptoms do not improve or  worsen     This chart was dictated using voice recognition software.  Despite best efforts to proofread,  errors can occur which can change the documentation meaning.    Fatima Blank, MD 09/19/20 [phone]

## 2020-09-18 NOTE — ED Notes (Signed)
Provider at bedside

## 2020-09-18 NOTE — ED Provider Notes (Signed)
[facility]    CSN: [id number] Arrival date & time: 09/18/20  1236      History   Chief Complaint Chief Complaint  Patient presents with   Palpitations    HPI Susan Wilkerson is a 49 y.o. female.   Patient here today for evaluation of palpitations shortness of breath that started last night.  She reports that she finished course of Paxil event on Monday, and that symptoms from [place] were much improved, but then last night and this morning she started to have more shortness of breath.  She reports that she has had some mild central chest discomfort last night, but this is improved.  She reports bilateral leg pain.  She does not report any treatment for symptoms.  The history is provided by the patient.  Palpitations Associated symptoms: chest pain, cough and shortness of breath    Past Medical History:  Diagnosis Date   Breakthrough bleeding on birth control pills 06/02/2006   history   Diabetes mellitus without complication ([place])    tx - enalapril type II   Endometrial polyp 09/30/2009   Glucosuria 08/30/07   H/O amenorrhea 2001   H/O dysmenorrhea 2005   H/O herpes simplex type 2 infection    H/O vaginal discharge 2010   Headache(784.0)    otc med prn   Hx gestational diabetes 2000   GDM with pregnancy, after delivery it did not go away   Hx: UTI (urinary tract infection) 1997   Irregular periods/menstrual cycles 2011   Lactose intolerance    Spotting 2011   history   SVD (spontaneous vaginal delivery)    x 1   Varicose veins     Patient Active Problem List   Diagnosis Date Noted   Essential hypertension 08/19/2020   Palpitations 08/18/2020   Fibroids 03/09/2013   S/P laparoscopic assisted vaginal hysterectomy (LAVH) 03/09/2013   Fibroid 08/24/2011    Past Surgical History:  Procedure Laterality Date   ABDOMINAL HYSTERECTOMY N/A 03/09/2013   Procedure: laporoscopic vaginal hysterectomy;  Surgeon: Ena Dawley, MD;  Location: [facility];  Service:  Gynecology;  Laterality: N/A;   BREAST BIOPSY  06/22/2006   BREAST EXCISIONAL BIOPSY Left 2008   benign   BREAST EXCISIONAL BIOPSY Left 1990   benign   BREAST SURGERY     right side x 3   CHOLECYSTECTOMY  2005   DILATION AND CURETTAGE OF UTERUS     polyp   LUMP REMOVED FROM RIGHT BREAST  1990   WISDOM TOOTH EXTRACTION      OB History     Gravida  1   Para  1   Term  1   Preterm      AB      Living  1      SAB      IAB      Ectopic      Multiple      Live Births  1            Home Medications    Prior to Admission medications   Medication Sig Start Date End Date Taking? Authorizing Provider  atorvastatin (LIPITOR) 10 MG tablet Take 10 mg by mouth once a week. 05/31/20   [provider]  Biotin 10000 MCG TABS Take by mouth every other day.    [provider]  cholecalciferol (VITAMIN D) 1000 UNITS tablet Take 1,000 Units by mouth daily as needed.     [provider]  diltiazem [place])  30 MG tablet Take 1 tablet (30 mg total) by mouth 4 (four) times daily. 08/19/20   Patwardhan, Manish J, MD  enalapril (VASOTEC) 5 MG tablet Take 5 mg by mouth daily.    [provider]  metFORMIN (GLUCOPHAGE) 500 MG tablet Take 500 mg by mouth 2 (two) times daily. 04/16/20   [provider]  Multiple Vitamins-Minerals (MULTIVITAMIN WITH MINERALS) tablet Take 1 tablet by mouth daily.    [provider]  vitamin B-12 (CYANOCOBALAMIN) 1000 MCG tablet Take 1,000 mcg by mouth daily.    [provider]    Family History Family History  Problem Relation Age of Onset   Hypertension Mother    Diabetes Mother    Cancer Mother        Uterine   Diabetes Father    Cancer Father        Mouth , lung , & stomach   Alcohol abuse Father    Cancer Sister    Hypertension Sister    Diabetes Sister        Four sisters  - all diabetic   Learning disabilities Sister        Schizophrenia   Drug abuse Sister        Cocaine    Breast cancer Sister    Drug abuse Sister    Cancer Sister    Diabetes Sister    Cancer Sister    Diabetes Sister    Asthma Sister    Diabetes Sister    Schizophrenia Sister    Drug abuse Sister    Aneurysm Sister    Cancer Paternal Aunt        Stomach & lung   Cancer Paternal Uncle        Lung   Diabetes Maternal Grandmother    Breast cancer Cousin     Social History Social History   Tobacco Use   Smoking status: Never   Smokeless tobacco: Never  Substance Use Topics   Alcohol use: No   Drug use: No     Allergies   Milk-related compounds   Review of Systems Review of Systems  Constitutional:  Negative for chills and fever.  HENT:  Negative for congestion, ear pain, sinus pressure and sore throat.   Eyes:  Negative for discharge and redness.  Respiratory:  Positive for cough and shortness of breath. Negative for wheezing.   Cardiovascular:  Positive for chest pain and palpitations.  Musculoskeletal:  Positive for myalgias.    Physical Exam Triage Vital Signs ED Triage Vitals  Enc Vitals Group     BP 09/18/20 1411 138/89     Pulse Rate 09/18/20 1411 99     Resp 09/18/20 1411 18     Temp 09/18/20 1411 98 F (36.7 C)     Temp Source 09/18/20 1411 Oral     SpO2 09/18/20 1411 99 %     Weight --      Height --      Head Circumference --      Peak Flow --      Pain Score 09/18/20 1408 4     Pain Loc --      Pain Edu? --      Excl. in [place]? --    No data found.  Updated Vital Signs BP 138/89 (BP Location: Right Arm)   Pulse 99   Temp 98 F (36.7 C) (Oral)   Resp 18   LMP 04/06/2013   SpO2 99%  Physical Exam Vitals and nursing note reviewed.  Constitutional:      General: She is not in acute distress.    Appearance: Normal appearance. She is not ill-appearing.  HENT:     Head: Normocephalic and atraumatic.     Nose: Nose normal.  Eyes:     Conjunctiva/sclera: Conjunctivae normal.  Cardiovascular:     Rate and Rhythm: Normal rate.      Heart sounds: Normal heart sounds. No murmur heard. Pulmonary:     Effort: Pulmonary effort is normal.     Breath sounds: Normal breath sounds. No wheezing, rhonchi or rales.     Comments: Patient unable to fully complete sentences due to shob Skin:    General: Skin is warm and dry.  Neurological:     Mental Status: She is alert.  Psychiatric:        Mood and Affect: Mood normal.        Thought Content: Thought content normal.     UC Treatments / Results  Labs (all labs ordered are listed, but only abnormal results are displayed) Labs Reviewed - No data to display  EKG   Radiology No results found.  Procedures Procedures (including critical care time)  Medications Ordered in UC Medications - No data to display  Initial Impression / Assessment and Plan / UC Course  I have reviewed the triage vital signs and the nursing notes.  Pertinent labs & imaging results that were available during my care of the patient were reviewed by me and considered in my medical decision making (see chart for details).  Given borderline tachycardia, shortness of breath and some chest discomfort, discussed further evaluation in the emergency room to rule out PE.  Patient is agreeable to same.  Final Clinical Impressions(s) / UC Diagnoses   Final diagnoses:  Palpitations  COVID-19   Discharge Instructions   None    ED Prescriptions   None    PDMP not reviewed this encounter.   Francene Finders, PA-C 09/18/20 1435

## 2020-09-18 NOTE — ED Triage Notes (Signed)
Pt here from home with c/o sob , pt is on day 7 of covid , pt has had periods of sob and cp before covid ,

## 2020-09-18 NOTE — ED Triage Notes (Signed)
Covid+ last week did regimen and seemed fine. Last night around 930-10pm got headache and and body aching. CBG 815am was 101 ate Cheerios then felt weird. 830am heart racing and SOB. Took BP 151/131 at 0840 before medications. Took medications. At 1030am BP 115/87 and HR 93 and felt better. Repots felt flutter in heart again. Had some diarrhea this morning.  Reports that palpitations would be worse after leaning over and then standing up.  Reports had palpitations last month and saw a cardiologist and had to reschedule getting heart monitor due to covid. Last seconds. Pt also adds taht she can't get enough air

## 2020-09-18 NOTE — ED Provider Notes (Signed)
Emergency Medicine Provider Triage Evaluation Note  Susan Wilkerson , a 50 y.o. female  was evaluated in triage.  Pt complains of shortness of breath that began this morning.  She is day 7 of COVID +.  Mild chest pain last night.  No fever.   Review of Systems  Positive: Bilateral leg pain Negative: Fever, chills, nausea, vomiting, abdominal pain  Physical Exam  BP 139/79 (BP Location: Left Arm)   Pulse 99   Temp 98.6 F (37 C) (Oral)   Resp 18   LMP 04/06/2013   SpO2 100%  Gen:   Awake, no distress   Resp:  Normal effort, CTA bialterally MSK:   Moves extremities without difficulty  Other:  Heart sounds are normal and regular  Medical Decision Making  Medically screening exam initiated at 5:21 PM.  Appropriate orders placed.  Marcelino Duster was informed that the remainder of the evaluation will be completed by another provider, this initial triage assessment does not replace that evaluation, and the importance of remaining in the ED until their evaluation is complete.     Hendricks Limes, PA-C 09/18/20 1723    Jeanell Sparrow, DO 09/18/20 2344

## 2020-09-18 NOTE — ED Notes (Signed)
Provided warm blanket states that she has fluids to drink that she doesn't need anything else

## 2020-09-18 NOTE — ED Notes (Signed)
Pt not in room at this time

## 2020-09-19 ENCOUNTER — Ambulatory Visit: Payer: BC Managed Care – PPO | Admitting: Cardiology

## 2020-09-25 ENCOUNTER — Ambulatory Visit: Payer: BC Managed Care – PPO | Admitting: Cardiology

## 2020-10-03 ENCOUNTER — Other Ambulatory Visit: Payer: Self-pay

## 2020-10-03 ENCOUNTER — Ambulatory Visit: Payer: BC Managed Care – PPO | Admitting: Cardiology

## 2020-10-03 ENCOUNTER — Encounter: Payer: Self-pay | Admitting: Cardiology

## 2020-10-03 VITALS — BP 126/75 | HR 92 | Temp 98.2°F | Ht 62.0 in | Wt 162.0 lb

## 2020-10-03 DIAGNOSIS — I10 Essential (primary) hypertension: Secondary | ICD-10-CM

## 2020-10-03 DIAGNOSIS — R002 Palpitations: Secondary | ICD-10-CM

## 2020-10-03 NOTE — Progress Notes (Signed)
Patient referred by Donald Prose, MD for palpitations  Subjective:   Susan Wilkerson, female    DOB: 05-17-1970, 50 y.o.   MRN: [id number]   Chief Complaint  Patient presents with   Palpitations   Follow-up   Shortness of Breath   Results     HPI  50 y.o. African American female with hypertension, type 2 diabetes mellitus, palpitations  Since her last visit with me, patient contracted COVID and required treatment with Paxlovid. During COVID illness, she did have complaints of palpitations and shortness of breath.  She took diltiazem once, but had significant lightheadedness associated with it.  Cardiac work-up reviewed with patient, details below.  Patient is being worked up for possible asthma.  Initial consultation HPI 08/2020: Patient works as an Medical illustrator.  She stays active and with walking at work and outside of, walks about 8,000-10,000 steps every day.  She denies any exertional chest pain or shortness of breath symptoms.  Last week, patient had 2 episodes of sudden onset palpitations while at rest, associated with lightheadedness or shortness of breath lasting for 3 minutes, resolving on their own.  Patient does not drink alcohol, does endorse drinking teas and occasional coffee.  Current Outpatient Medications on File Prior to Visit  Medication Sig Dispense Refill   atorvastatin (LIPITOR) 10 MG tablet Take 10 mg by mouth once a week.     Biotin 10000 MCG TABS Take by mouth every other day.     cholecalciferol (VITAMIN D) 1000 UNITS tablet Take 1,000 Units by mouth daily as needed.      diltiazem (CARDIZEM) 30 MG tablet Take 1 tablet (30 mg total) by mouth 4 (four) times daily. 30 tablet 2   enalapril (VASOTEC) 5 MG tablet Take 5 mg by mouth daily.     metFORMIN (GLUCOPHAGE) 500 MG tablet Take 500 mg by mouth 2 (two) times daily.     Multiple Vitamins-Minerals (MULTIVITAMIN WITH MINERALS) tablet Take 1 tablet by mouth daily.     vitamin B-12  (CYANOCOBALAMIN) 1000 MCG tablet Take 1,000 mcg by mouth daily.     No current facility-administered medications on file prior to visit.    Cardiovascular and other pertinent studies:  Echocardiogram 09/05/2020:  Normal LV systolic function with visual EF 60-65%. Left ventricle cavity  is normal in size. Mild left ventricular hypertrophy. Normal global wall  motion. Normal diastolic filling pattern, normal LAP.  No significant valvular abnormalities.  No prior study for comparison.  Mobile cardiac telemetry 14 days 08/19/2020 - 09/02/2020: Dominant rhythm: Sinus  HR 61-164 bpm. Avg HR 94 bpm Occasional episodes of first degree and second degree type 1 AV block seen during sleep hours.  0 episodes of SVT/VT <1% isolated SVE/VE No atrial fibrillation/atrial flutter/SVT/VT/high grade AV block, sinus pause >3sec noted. 4 patient triggered events correlate with sinus rhythm/sinus tachcyardia  EKG 08/19/2020: Sinus rhythm 92 bpm Normal EKG   Recent labs: 08/01/2020: Glucose 104, BUN/Cr 11/0.82. EGFR >60. Na/K 136/3.7.  Trop HS <2,  H/H 11.8/37.4. MCV 86. Platelets 271 HbA1C 6.3% Chol 167, TG 66, HDL 57, LDL 97 TSH 1.6 normal   Review of Systems  Cardiovascular:  Positive for palpitations (associated with lightheadedness/palpitations). Negative for chest pain, dyspnea on exertion, leg swelling and syncope.  Respiratory:  Positive for shortness of breath.         Vitals:   10/03/20 1056  BP: 126/75  Pulse: 92  Temp: 98.2 F (36.8 C)  SpO2: 98%  Body mass index is 29.63 kg/m. Filed Weights   10/03/20 1056  Weight: 162 lb (73.5 kg)     Objective:   Physical Exam Vitals and nursing note reviewed.  Constitutional:      General: She is not in acute distress. Neck:     Vascular: No JVD.  Cardiovascular:     Rate and Rhythm: Normal rate and regular rhythm.     Heart sounds: Normal heart sounds. No murmur heard. Pulmonary:     Effort: Pulmonary effort is normal.      Breath sounds: Normal breath sounds. No wheezing or rales.  Musculoskeletal:     Right lower leg: No edema.     Left lower leg: No edema.        Assessment & Recommendations:    50 y.o. African American female with hypertension, type 2 diabetes mellitus, referred for palpitations  Palpitations, shortness of breath: Structurally normal heart.  No significant any noted on cardiac telemetry. Agree with continued work-up for possible asthma.  Hypertension: Well-controlled.  Follow-up as needed.   Nigel Mormon, MD Pager: [phone] Office: [phone]

## 2020-10-04 ENCOUNTER — Ambulatory Visit: Payer: BC Managed Care – PPO | Admitting: Cardiovascular Disease

## 2020-11-13 ENCOUNTER — Ambulatory Visit
Admission: RE | Admit: 2020-11-13 | Discharge: 2020-11-13 | Disposition: A | Discharge status: Home / Self Care | Payer: BC Managed Care – PPO | Source: Ambulatory Visit | Attending: Sports Medicine | Admitting: Sports Medicine

## 2020-11-13 ENCOUNTER — Other Ambulatory Visit: Payer: Self-pay | Admitting: Sports Medicine

## 2020-11-13 DIAGNOSIS — M25511 Pain in right shoulder: Secondary | ICD-10-CM

## 2020-11-13 IMAGING — DX DG SC JOINTS 3+V
3 series · 3 of 3 positions shown · non-contrast
Comparison: Chest x-ray 09/18/2020.

CLINICAL DATA: Pain in the right sternoclavicular joint.

EXAM:
STERNOCLAVICULAR JOINTS - 3+ VIEW

[dg sc joints (1 of 3)]
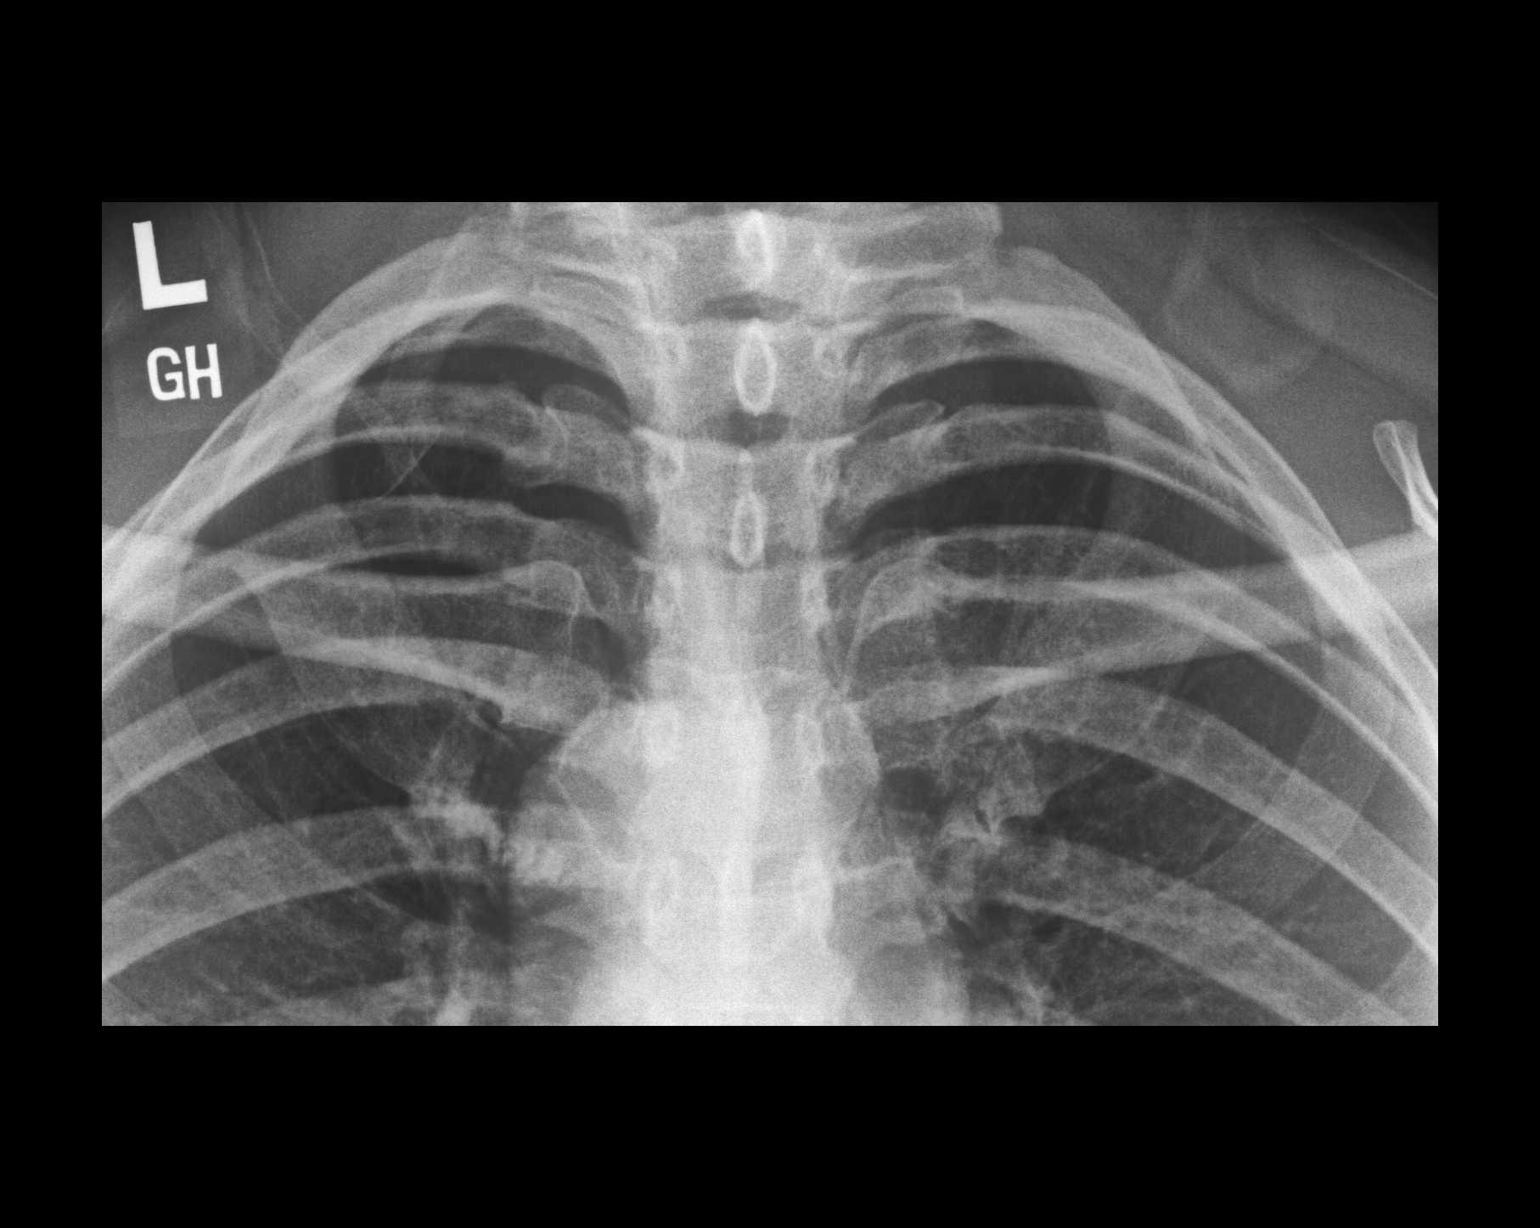

[dg sc joints (2 of 3)]
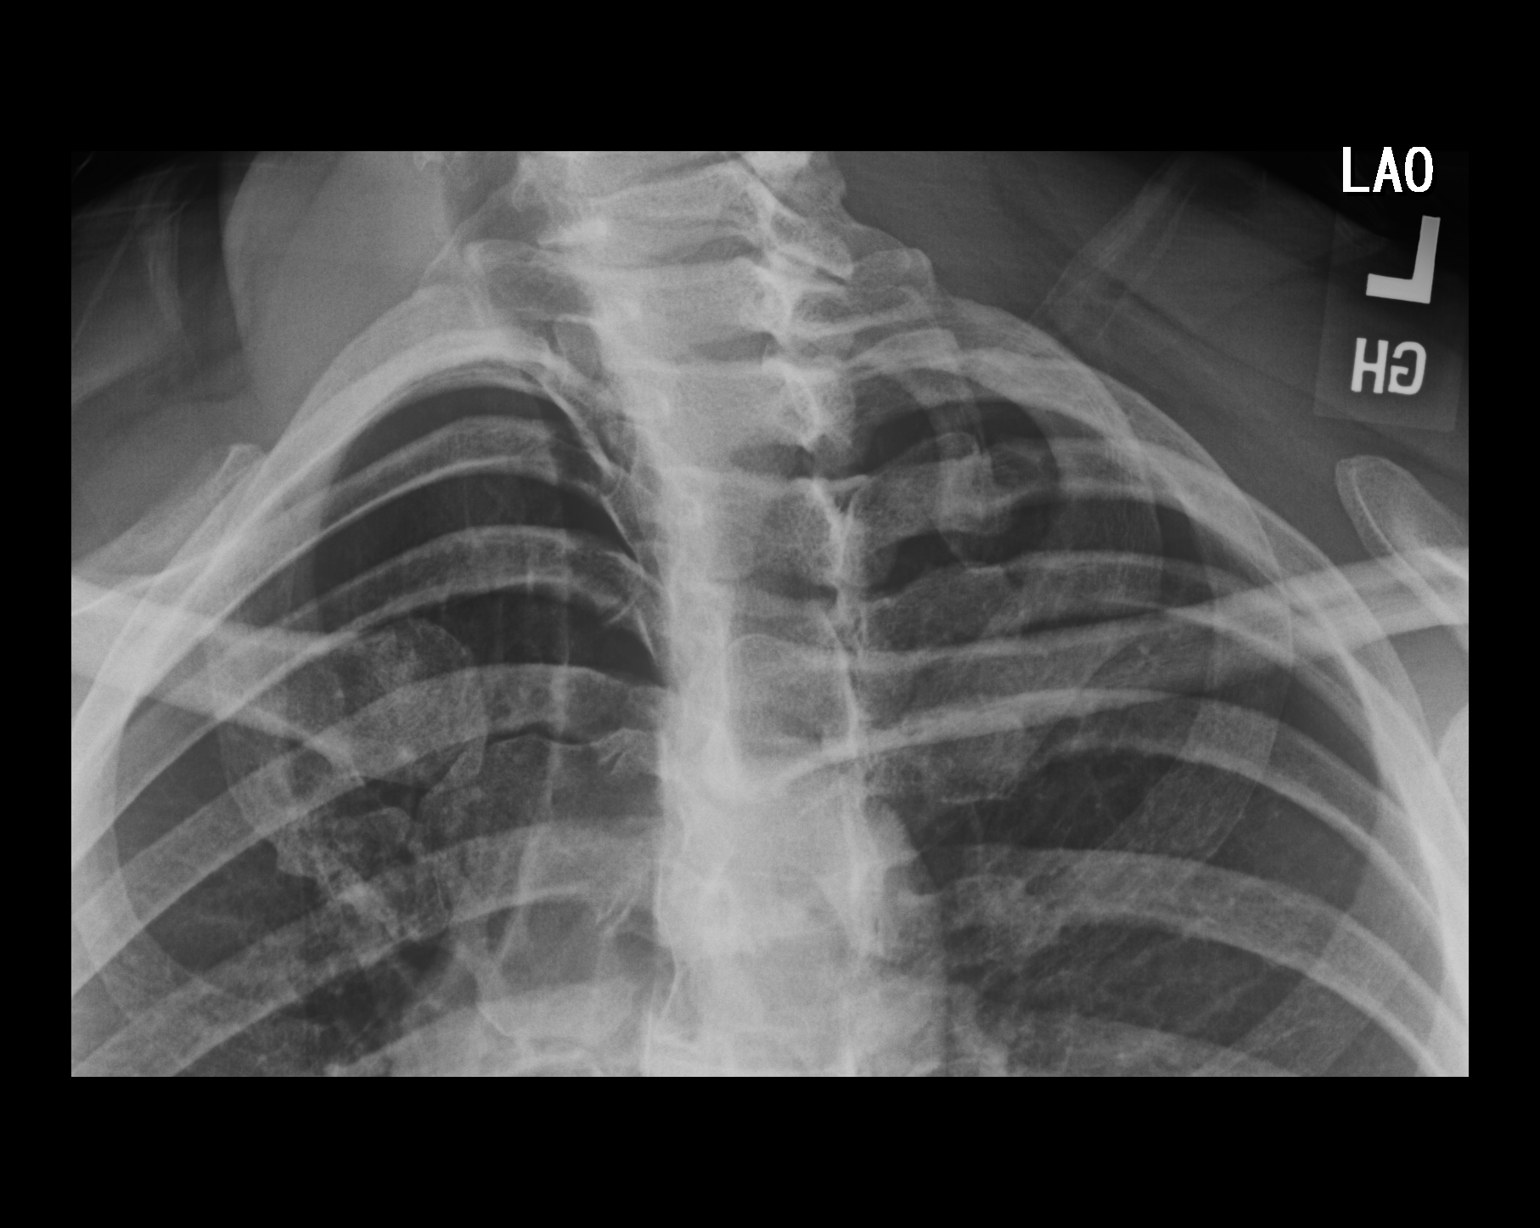

[dg sc joints (3 of 3)]
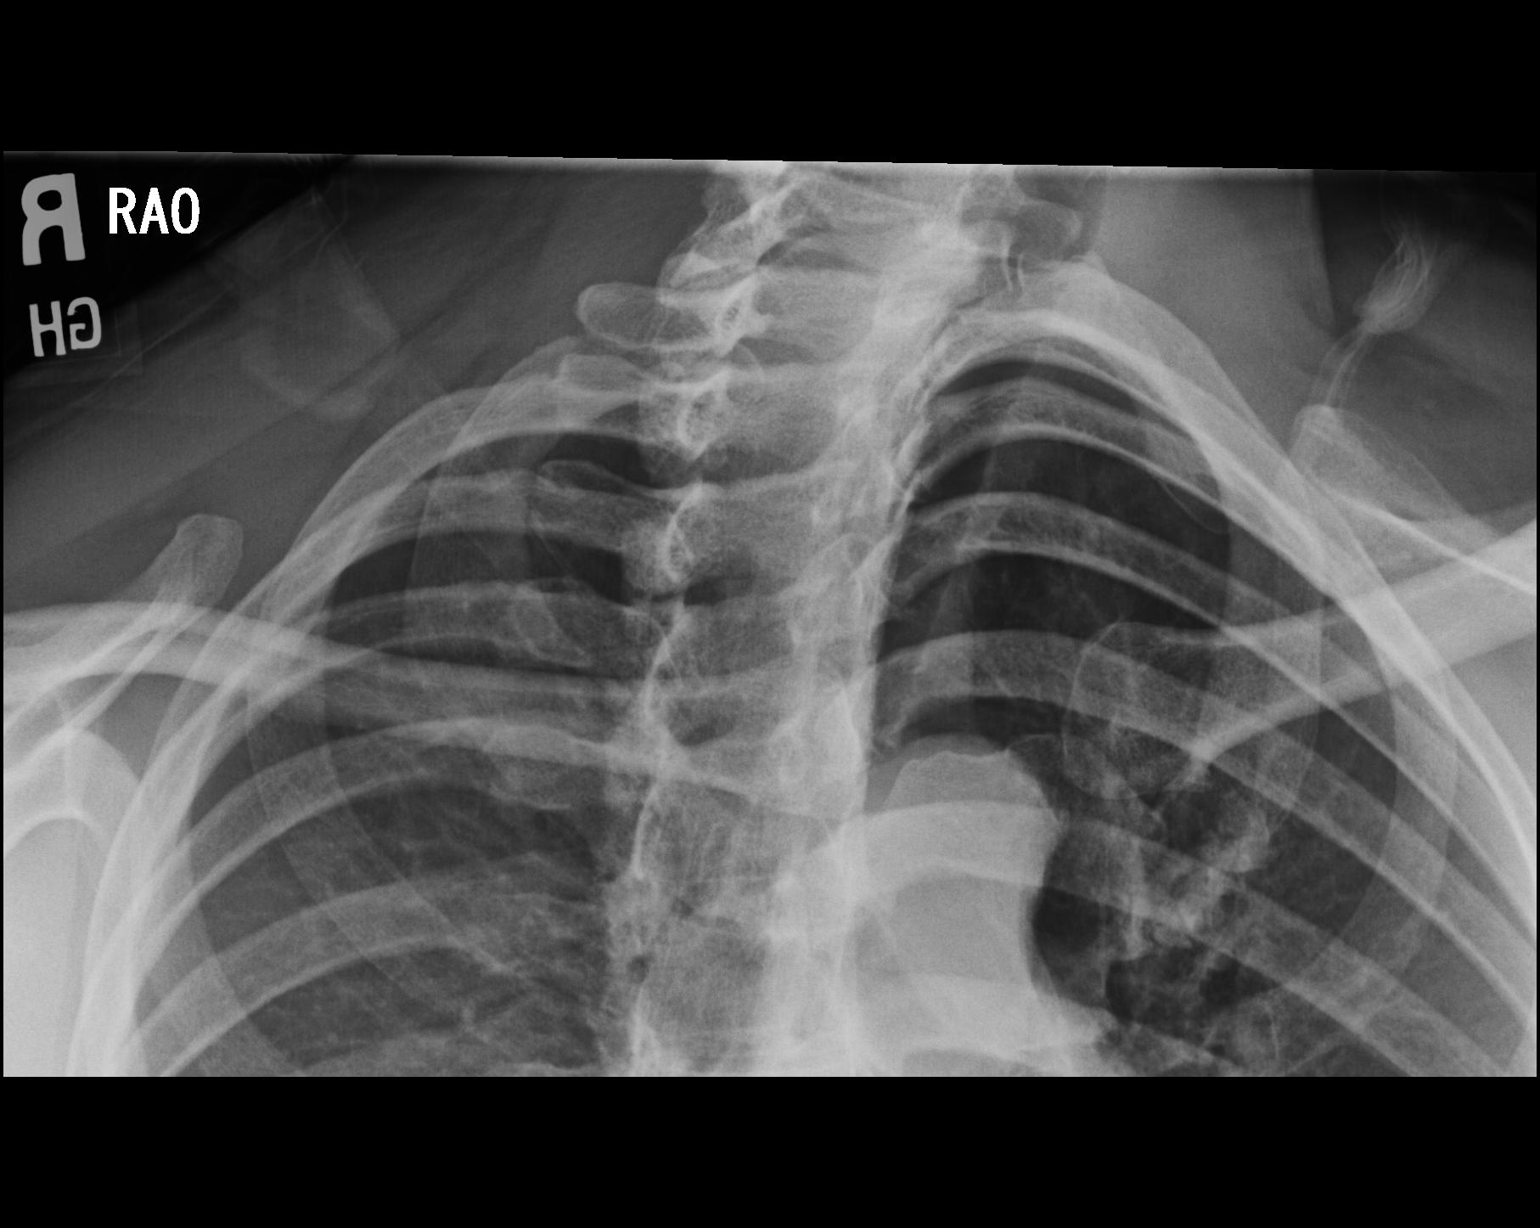

[3 of 3 positions shown; findings below may reference images not displayed]

FINDINGS: There is no evidence for acute fracture. The left sternoclavicular
joint appears well aligned. The right sternoclavicular joint
demonstrates mild overlap. This may be positional or related to
subluxation/dislocation. Soft tissues are grossly within normal
limits.
IMPRESSION: 1. No acute fracture.
2. Cannot exclude right sternoclavicular joint subluxation or
dislocation. Please correlate clinically. By

## 2021-07-21 ENCOUNTER — Other Ambulatory Visit: Payer: Self-pay | Admitting: Family Medicine

## 2021-07-21 DIAGNOSIS — Z1231 Encounter for screening mammogram for malignant neoplasm of breast: Secondary | ICD-10-CM

## 2021-08-15 ENCOUNTER — Ambulatory Visit
Admission: RE | Admit: 2021-08-15 | Discharge: 2021-08-15 | Disposition: A | Discharge status: Home / Self Care | Payer: BC Managed Care – PPO | Source: Ambulatory Visit | Attending: Family Medicine | Admitting: Family Medicine

## 2021-08-15 DIAGNOSIS — Z1231 Encounter for screening mammogram for malignant neoplasm of breast: Secondary | ICD-10-CM

## 2022-07-07 ENCOUNTER — Other Ambulatory Visit: Payer: Self-pay | Admitting: Family Medicine

## 2022-07-07 DIAGNOSIS — Z1231 Encounter for screening mammogram for malignant neoplasm of breast: Secondary | ICD-10-CM

## 2022-08-19 ENCOUNTER — Ambulatory Visit
Admission: RE | Admit: 2022-08-19 | Discharge: 2022-08-19 | Disposition: A | Discharge status: Home / Self Care | Payer: Managed Care, Other (non HMO) | Source: Ambulatory Visit | Attending: Family Medicine | Admitting: Family Medicine

## 2022-08-19 DIAGNOSIS — Z1231 Encounter for screening mammogram for malignant neoplasm of breast: Secondary | ICD-10-CM

## 2022-08-26 NOTE — Telephone Encounter (Signed)
No action done

## 2022-10-23 ENCOUNTER — Encounter: Payer: Self-pay | Admitting: Family Medicine

## 2022-10-27 ENCOUNTER — Other Ambulatory Visit: Payer: Self-pay | Admitting: Family Medicine

## 2022-10-27 DIAGNOSIS — R9230 Dense breasts, unspecified: Secondary | ICD-10-CM

## 2022-11-11 ENCOUNTER — Ambulatory Visit
Admission: RE | Admit: 2022-11-11 | Discharge: 2022-11-11 | Disposition: A | Discharge status: Home / Self Care | Payer: Managed Care, Other (non HMO) | Source: Ambulatory Visit | Attending: Family Medicine | Admitting: Family Medicine

## 2022-11-11 ENCOUNTER — Ambulatory Visit
Admission: RE | Admit: 2022-11-11 | Discharge: 2022-11-11 | Disposition: A | Discharge status: Home / Self Care | Payer: Self-pay | Source: Ambulatory Visit | Attending: Family Medicine | Admitting: Family Medicine

## 2022-11-11 ENCOUNTER — Other Ambulatory Visit: Payer: Self-pay | Admitting: Family Medicine

## 2022-11-11 DIAGNOSIS — N644 Mastodynia: Secondary | ICD-10-CM

## 2022-11-11 DIAGNOSIS — R9230 Dense breasts, unspecified: Secondary | ICD-10-CM

## 2022-11-11 DIAGNOSIS — N6310 Unspecified lump in the right breast, unspecified quadrant: Secondary | ICD-10-CM

## 2023-10-20 ENCOUNTER — Other Ambulatory Visit: Payer: Self-pay | Admitting: Family Medicine

## 2023-10-20 DIAGNOSIS — Z1231 Encounter for screening mammogram for malignant neoplasm of breast: Secondary | ICD-10-CM

## 2023-11-12 ENCOUNTER — Ambulatory Visit
Admission: RE | Admit: 2023-11-12 | Discharge: 2023-11-12 | Disposition: A | Discharge status: Home / Self Care | Source: Ambulatory Visit | Attending: Family Medicine | Admitting: Family Medicine

## 2023-11-12 DIAGNOSIS — Z1231 Encounter for screening mammogram for malignant neoplasm of breast: Secondary | ICD-10-CM

## 2024-02-07 ENCOUNTER — Other Ambulatory Visit (HOSPITAL_COMMUNITY): Payer: Self-pay

## 2024-02-07 DIAGNOSIS — M25552 Pain in left hip: Secondary | ICD-10-CM

## 2024-02-28 ENCOUNTER — Ambulatory Visit (HOSPITAL_COMMUNITY)
